# Patient Record
Sex: Male | Born: 1960 | Race: White | Hispanic: No | Marital: Married | Smoking: Never smoker
Health system: Southern US, Community
[De-identification: ages and names within clinical notes are randomized; demographics above are authoritative.]

## PROBLEM LIST (undated history)

## (undated) DIAGNOSIS — E781 Pure hyperglyceridemia: Secondary | ICD-10-CM

## (undated) DIAGNOSIS — J309 Allergic rhinitis, unspecified: Secondary | ICD-10-CM

## (undated) DIAGNOSIS — F40243 Fear of flying: Secondary | ICD-10-CM

## (undated) DIAGNOSIS — D693 Immune thrombocytopenic purpura: Secondary | ICD-10-CM

## (undated) HISTORY — DX: Allergic rhinitis, unspecified: J30.9

## (undated) HISTORY — DX: Immune thrombocytopenic purpura: D69.3

## (undated) HISTORY — DX: Fear of flying: F40.243

## (undated) HISTORY — DX: Pure hyperglyceridemia: E78.1

---

## 2002-04-14 HISTORY — PX: OTHER SURGICAL HISTORY: SHX169

## 2004-03-15 HISTORY — PX: HERNIA REPAIR: SHX51

## 2010-05-02 LAB — PSA: PSA: 0.57

## 2011-04-22 LAB — HEPATIC FUNCTION PANEL
ALT: 14 U/L (ref 10–40)
AST: 14 U/L (ref 14–40)

## 2011-04-22 LAB — LIPID PANEL
CHOLESTEROL: 178 mg/dL (ref 0–200)
HDL: 42 mg/dL (ref 35–70)
LDL CALC: 109 mg/dL
Triglycerides: 134 mg/dL (ref 40–160)

## 2011-04-22 LAB — CBC AND DIFFERENTIAL
Hemoglobin: 16.1 g/dL (ref 13.5–17.5)
Platelets: 439 10*3/uL — AB (ref 150–399)
WBC: 9.7 10*3/mL

## 2011-04-22 LAB — BASIC METABOLIC PANEL
Creatinine: 0.7 mg/dL (ref 0.6–1.3)
Glucose: 90 mg/dL

## 2014-03-08 ENCOUNTER — Encounter: Payer: Self-pay | Admitting: Family Medicine

## 2014-03-08 ENCOUNTER — Ambulatory Visit (INDEPENDENT_AMBULATORY_CARE_PROVIDER_SITE_OTHER): Payer: BC Managed Care – PPO | Admitting: Family Medicine

## 2014-03-08 VITALS — BP 122/76 | HR 76 | Ht 75.0 in | Wt 227.0 lb

## 2014-03-08 DIAGNOSIS — Z0000 Encounter for general adult medical examination without abnormal findings: Secondary | ICD-10-CM | POA: Diagnosis not present

## 2014-03-08 DIAGNOSIS — D693 Immune thrombocytopenic purpura: Secondary | ICD-10-CM | POA: Diagnosis not present

## 2014-03-08 DIAGNOSIS — Q8909 Congenital malformations of spleen: Secondary | ICD-10-CM | POA: Diagnosis not present

## 2014-03-08 DIAGNOSIS — Z1211 Encounter for screening for malignant neoplasm of colon: Secondary | ICD-10-CM

## 2014-03-08 DIAGNOSIS — Z23 Encounter for immunization: Secondary | ICD-10-CM

## 2014-03-08 DIAGNOSIS — Q8901 Asplenia (congenital): Secondary | ICD-10-CM | POA: Insufficient documentation

## 2014-03-08 DIAGNOSIS — Z13220 Encounter for screening for lipoid disorders: Secondary | ICD-10-CM

## 2014-03-08 DIAGNOSIS — L0100 Impetigo, unspecified: Secondary | ICD-10-CM

## 2014-03-08 DIAGNOSIS — Z125 Encounter for screening for malignant neoplasm of prostate: Secondary | ICD-10-CM

## 2014-03-08 DIAGNOSIS — Z131 Encounter for screening for diabetes mellitus: Secondary | ICD-10-CM

## 2014-03-08 HISTORY — DX: Immune thrombocytopenic purpura: D69.3

## 2014-03-08 LAB — CBC
HCT: 46.2 % (ref 39.0–52.0)
Hemoglobin: 16.6 g/dL (ref 13.0–17.0)
MCH: 33.1 pg (ref 26.0–34.0)
MCHC: 35.9 g/dL (ref 30.0–36.0)
MCV: 92 fL (ref 78.0–100.0)
PLATELETS: 491 10*3/uL — AB (ref 150–400)
RBC: 5.02 MIL/uL (ref 4.22–5.81)
RDW: 13.7 % (ref 11.5–15.5)
WBC: 8.1 10*3/uL (ref 4.0–10.5)

## 2014-03-08 MED ORDER — MUPIROCIN 2 % EX OINT: TOPICAL_OINTMENT | CUTANEOUS | Status: DC

## 2014-03-08 NOTE — Patient Instructions (Addendum)
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  Dr. Lajoyce Lauber General Advice Following Your Complete Physical Exam  The Benefits of Regular Exercise: Unless you suffer from an uncontrolled cardiovascular condition, studies strongly suggest that regular exercise and physical activity will add to both the quality and length of your life.  The World Health Organization recommends 150 minutes of moderate intensity aerobic activity every week.  This is best split over 3-4 days a week, and can be as simple as a brisk walk for just over 35 minutes "most days of the week".  This type of exercise has been shown to lower LDL-Cholesterol, lower average blood sugars, lower blood pressure, lower cardiovascular disease risk, improve memory, and increase one's overall sense of wellbeing.  The addition of anaerobic (or "strength training") exercises offers additional benefits including but not limited to increased metabolism, prevention of osteoporosis, and improved overall cholesterol levels.  How Can I Strive For A Low-Fat Diet?: Current guidelines recommend that 25-35 percent of your daily energy (food) intake should come from fats.  One might ask how can this be achieved without having to dissect each meal on a daily basis?  Switch to skim or 1% milk instead of whole milk.  Focus on lean meats such as ground [place], fresh fish, baked chicken, and lean cuts of beef as your source of dietary protein.  Limit saturated fat consumption to less than 10% of your daily caloric intake.  Limit trans fatty acid consumption primarily by limiting synthetic trans fats such as partially hydrogenated oils (Ex: fried fast foods).  Substitute olive or vegetable oil for solid fats where possible.  Moderation of Salt Intake: Provided you don't carry a diagnosis of congestive heart failure nor renal failure, I recommend a daily allowance of no more than  2300 mg of salt (sodium).  Keeping under this daily goal is associated with a decreased risk of cardiovascular events, creeping above it can lead to elevated blood pressures and increases your risk of cardiovascular events.  Milligrams (mg) of salt is listed on all nutrition labels, and your daily intake can add up faster than you think.  Most canned and frozen dinners can pack in over half your daily salt allowance in one meal.    Lifestyle Health Risks: Certain lifestyle choices carry specific health risks.  As you may already know, tobacco use has been associated with increasing one's risk of cardiovascular disease, pulmonary disease, numerous cancers, among many other issues.  What you may not know is that there are medications and nicotine replacement strategies that can more than double your chances of successfully quitting.  I would be thrilled to help manage your quitting strategy if you currently use tobacco products.  When it comes to alcohol use, I've yet to find an "ideal" daily allowance.  Provided an individual does not have a medical condition that is exacerbated by alcohol consumption, general guidelines determine "safe drinking" as no more than two standard drinks for a man or no more than one standard drink for a male per day.  However, much debate still exists on whether any amount of alcohol consumption is technically "safe".  My general advice, keep alcohol consumption to a minimum for general health promotion.  If you or others believe that alcohol, tobacco, or recreational drug use is interfering with your life, I would be happy to provide confidential counseling regarding treatment options.  General "Over  The Counter" Nutrition Advice: Postmenopausal women should aim for a daily calcium intake of 1200 mg, however a significant portion of this might already be provided by diets including milk, yogurt, cheese, and other dairy products.  Vitamin D has been shown to help preserve bone  density, prevent fatigue, and has even been shown to help reduce falls in the elderly.  Ensuring a daily intake of 800 Units of Vitamin D is a good place to start to enjoy the above benefits, we can easily check your Vitamin D level to see if you'd potentially benefit from supplementation beyond 800 Units a day.  Folic Acid intake should be of particular concern to women of childbearing age.  Daily consumption of 789-381 mcg of Folic Acid is recommended to minimize the chance of spinal cord defects in a fetus should pregnancy occur.    For many adults, accidents still remain one of the most common culprits when it comes to cause of death.  Some of the simplest but most effective preventitive habits you can adopt include regular seatbelt use, proper helmet use, securing firearms, and regularly testing your smoke and carbon monoxide detectors.  Egan Berkheimer B. [place], [place], [place] [address] Phone: [phone]

## 2014-03-08 NOTE — Progress Notes (Signed)
CC: Jon Galvan is a 53 y.o. 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 He believes he had the Pneumovax soon after this surgery but has never had Prevnar.  Review of Systems - General ROS: negative for - chills, fever, night sweats, weight gain or weight loss Ophthalmic ROS: negative for - decreased vision Psychological ROS: negative for - anxiety or depression ENT ROS: negative for - hearing change, nasal congestion, tinnitus or allergies Hematological and Lymphatic ROS: negative for - bleeding problems, bruising or swollen lymph nodes Breast ROS: negative Respiratory ROS: no cough, shortness of breath, or wheezing Cardiovascular ROS: no chest pain or dyspnea on exertion Gastrointestinal ROS: no abdominal pain, change in bowel habits, or black or bloody stools Genito-Urinary ROS: negative for - genital discharge, genital ulcers, incontinence or abnormal bleeding from genitals Musculoskeletal ROS: negative for - joint pain or muscle pain Neurological ROS: negative for - headaches or memory loss Dermatological ROS: negative for lumps, mole changes, rash and skin lesion changes  History reviewed. No pertinent past medical history.  Past Surgical History  Procedure Laterality Date  . Spleenectomy  04/2002  . Hernia repair  03/2004   History reviewed. No pertinent family history.  History   Social History  . Marital Status: Married    Spouse Name: N/A    Number of  Children: N/A  . Years of Education: N/A   Occupational History  . Not on file.   Social History Main Topics  . Smoking status: Never Smoker   . Smokeless tobacco: Not on file  . Alcohol Use: Not on file  . Drug Use: No  . Sexual Activity: Yes    Partners: Female   Other Topics Concern  . Not on file   Social History Narrative  . No narrative on file     Objective: BP 122/76  Pulse 76  Ht 6\' 3"  (1.905 m)  Wt 227 lb (102.967 kg)  BMI 28.37 kg/m2  General: No Acute Distress HEENT: Atraumatic, normocephalic, conjunctivae normal without scleral icterus.  No nasal discharge, hearing grossly intact, TMs with good landmarks bilaterally with no middle ear abnormalities, posterior pharynx clear without oral lesions. Mild ulceration with crusting just lateral to the left lip fold not involving the mucosa of the lips. Neck: Supple, trachea midline, no cervical nor supraclavicular adenopathy. Pulmonary: Clear to auscultation bilaterally without wheezing, rhonchi, nor rales. Cardiac: Regular rate and rhythm.  No murmurs, rubs, nor gallops. No peripheral edema.  2+ peripheral pulses bilaterally. Abdomen: Bowel sounds normal.  No masses.  Non-tender without rebound.  Negative Murphy's sign. MSK: Grossly intact, no signs of weakness.  Full strength throughout upper and lower extremities.  Full ROM in upper and lower extremities.  No midline spinal tenderness. Neuro: Gait unremarkable, CN II-XII grossly intact.  C5-C6 Reflex 2/4 Bilaterally, L4 Reflex 2/4 Bilaterally.  Cerebellar function intact. Skin: No rashes. Psych: Alert and oriented to person/place/time.  Thought process normal. No anxiety/depression. Assessment & Plan: Jon Galvan was seen today for establish  care.  Diagnoses and associated orders for this visit:  Annual physical exam - PSA - BASIC METABOLIC PANEL WITH GFR - Lipid panel - CBC - Tdap vaccine greater than or equal to 7yo IM  Lipid screening  Diabetes mellitus  screening  Prostate cancer screening  ITP (idiopathic thrombocytopenic purpura)  Colon cancer screening - Ambulatory referral to Gastroenterology  Impetigo - mupirocin ointment (BACTROBAN) 2 %; Apply to ingrown hair twice a day for ten days.    Healthy lifestyle interventions including but not limited to regular exercise, a healthy low fat diet, moderation of salt intake, the dangers of tobacco/alcohol/recreational drug use, nutrition supplementation, and accident avoidance were discussed with the patient and a handout was provided for future reference. Start Bactroban to the ulceration on the face if not improved after one week return for biopsy to rule out squamous versus basal cell carcinoma   Return if symptoms worsen or fail to improve, for biopsy of skin ulceration just left of the lips.

## 2014-03-09 ENCOUNTER — Encounter: Payer: Self-pay | Admitting: Family Medicine

## 2014-03-09 DIAGNOSIS — D473 Essential (hemorrhagic) thrombocythemia: Secondary | ICD-10-CM | POA: Insufficient documentation

## 2014-03-09 DIAGNOSIS — E781 Pure hyperglyceridemia: Secondary | ICD-10-CM | POA: Insufficient documentation

## 2014-03-09 DIAGNOSIS — M5412 Radiculopathy, cervical region: Secondary | ICD-10-CM | POA: Insufficient documentation

## 2014-03-09 DIAGNOSIS — D75839 Thrombocytosis, unspecified: Secondary | ICD-10-CM | POA: Insufficient documentation

## 2014-03-09 DIAGNOSIS — K130 Diseases of lips: Secondary | ICD-10-CM | POA: Insufficient documentation

## 2014-03-09 DIAGNOSIS — F411 Generalized anxiety disorder: Secondary | ICD-10-CM | POA: Insufficient documentation

## 2014-03-09 HISTORY — DX: Pure hyperglyceridemia: E78.1

## 2014-03-09 LAB — BASIC METABOLIC PANEL WITH GFR
BUN: 12 mg/dL (ref 6–23)
CO2: 23 mEq/L (ref 19–32)
Calcium: 9.3 mg/dL (ref 8.4–10.5)
Chloride: 104 mEq/L (ref 96–112)
Creat: 0.79 mg/dL (ref 0.50–1.35)
GFR, Est African American: >89 mL/min
GFR, Est Non African American: >89 mL/min
Glucose, Bld: 95 mg/dL (ref 70–99)
Potassium: 4.3 mEq/L (ref 3.5–5.3)
Sodium: 135 mEq/L (ref 135–145)

## 2014-03-09 LAB — LIPID PANEL
CHOL/HDL RATIO: 4.9 ratio
Cholesterol: 180 mg/dL (ref 0–200)
HDL: 37 mg/dL — ABNORMAL LOW (ref >39)
LDL Cholesterol: 88 mg/dL (ref 0–99)
Triglycerides: 274 mg/dL — ABNORMAL HIGH (ref <150)
VLDL: 55 mg/dL — ABNORMAL HIGH (ref 0–40)

## 2014-03-09 LAB — PSA: PSA: 0.72 ng/mL (ref <=4.00)

## 2014-03-11 ENCOUNTER — Encounter: Payer: Self-pay | Admitting: Family Medicine

## 2014-03-14 ENCOUNTER — Encounter: Payer: Self-pay | Admitting: Family Medicine

## 2014-03-14 DIAGNOSIS — Z299 Encounter for prophylactic measures, unspecified: Secondary | ICD-10-CM | POA: Insufficient documentation

## 2014-08-12 ENCOUNTER — Ambulatory Visit (INDEPENDENT_AMBULATORY_CARE_PROVIDER_SITE_OTHER): Payer: BLUE CROSS/BLUE SHIELD | Admitting: Family Medicine

## 2014-08-12 ENCOUNTER — Encounter: Payer: Self-pay | Admitting: Family Medicine

## 2014-08-12 VITALS — BP 139/87 | HR 91 | Wt 233.0 lb

## 2014-08-12 DIAGNOSIS — L98491 Non-pressure chronic ulcer of skin of other sites limited to breakdown of skin: Secondary | ICD-10-CM | POA: Diagnosis not present

## 2014-08-12 DIAGNOSIS — F411 Generalized anxiety disorder: Secondary | ICD-10-CM

## 2014-08-12 MED ORDER — CEPHALEXIN 500 MG PO CAPS: 500.0000 mg | ORAL_CAPSULE | Freq: Three times a day (TID) | ORAL | Status: DC

## 2014-08-12 MED ORDER — CLONAZEPAM 0.5 MG PO TABS
ORAL_TABLET | ORAL | Status: DC
Start: 2014-08-12 — End: 2014-09-30

## 2014-08-12 NOTE — Progress Notes (Signed)
CC: Jon Galvan is a 54 y.o. male is here for lesion on near mouth   Subjective: HPI:  Complains of a return of an ulceration just lateral to the left crease of the lips. It's been present off and on for a few years now. It seems to get better with Bactroban however this treatment has been more ineffective over the past few weeks. It is tender, occasionally bleeds, and he denies any other skin lesions elsewhere. Denies fevers, chills, swollen lymph nodes. No other interventions other than above.  Complaints of anxiety while flying. This has been present ever since he was a child. Since he turned 21 he usually drinks heavily to cope with the anxiety of flying. He has work obligations that will require him to fly to [place] in February and he does not want to drink alcohol to overcome his anxiety. He wants her something he can take to help reduce his anxiety other than alcohol. He denies any other anxiety that interferes with his quality of life. No history of substance abuse. Denies any other mental disturbance other than that described above   Review Of Systems Outlined In HPI  No past medical history on file.  Past Surgical History  Procedure Laterality Date  . Spleenectomy  04/2002  . Hernia repair Right 03/2004   No family history on file.  History   Social History  . Marital Status: Married    Spouse Name: N/A    Number of Children: N/A  . Years of Education: N/A   Occupational History  . Not on file.   Social History Main Topics  . Smoking status: Never Smoker   . Smokeless tobacco: Not on file  . Alcohol Use: Not on file  . Drug Use: No  . Sexual Activity:    Partners: Female   Other Topics Concern  . Not on file   Social History Narrative     Objective: BP 139/87 mmHg  Pulse 91  Wt 233 lb (105.688 kg)  General: Alert and Oriented, No Acute Distress HEENT: Pupils equal, round, reactive to light. Conjunctivae clear.  Moist mucous membranes. 5 mm diameter shallow  ulceration approximately 1 m lateral to the left lip crease. Lungs: Clear and comfortable work of breathing  Cardiac: Regular rate and rhythm.  Extremities: No peripheral edema.  Strong peripheral pulses.  Mental Status: No depression, anxiety, nor agitation. Skin: Warm and dry.  Assessment & Plan: Jon Galvan was seen today for lesion on near mouth.  Diagnoses and associated orders for this visit:  Skin ulceration, limited to breakdown of skin - cephALEXin (KEFLEX) 500 MG capsule; Take 1 capsule (500 mg total) by mouth 3 (three) times daily.  Anxiety state - clonazePAM (KLONOPIN) 0.5 MG tablet; One to two by mouth 30 minutes prior to flight as needed for anxiety.    Skin ulceration: He's responded to Keflex in the past and therefore restart this regimen and continue topical Bactroban. I've advised him to return after 1 week of treatment that has not fully healed because this would warrant skin biopsy. Anxiety: Parotid with as needed clonazepam to help with reducing his fear of flying. Advised to not drink alcohol while taking this medication and avoid operating heavy machinery   Return if symptoms worsen or fail to improve.

## 2014-08-17 ENCOUNTER — Telehealth: Payer: Self-pay | Admitting: Family Medicine

## 2014-08-17 NOTE — Telephone Encounter (Signed)
Dr Ileene Rubens. I called [facility] to follow up on referral and I was told by Lennette Bihari that Mr. Hairfield told them on 8/31 that he will call them back  to schedule an appt. I left vm for Mr. Raborn to call them to schedule appt.

## 2014-09-30 ENCOUNTER — Encounter: Payer: Self-pay | Admitting: Sports Medicine

## 2014-09-30 ENCOUNTER — Ambulatory Visit (INDEPENDENT_AMBULATORY_CARE_PROVIDER_SITE_OTHER): Payer: BLUE CROSS/BLUE SHIELD | Admitting: Sports Medicine

## 2014-09-30 VITALS — BP 124/79 | HR 58 | Temp 98.7°F | Ht 75.0 in | Wt 222.0 lb

## 2014-09-30 DIAGNOSIS — J309 Allergic rhinitis, unspecified: Secondary | ICD-10-CM

## 2014-09-30 DIAGNOSIS — J301 Allergic rhinitis due to pollen: Secondary | ICD-10-CM

## 2014-09-30 HISTORY — DX: Allergic rhinitis, unspecified: J30.9

## 2014-09-30 MED ORDER — FLUTICASONE PROPIONATE 50 MCG/ACT NA SUSP: NASAL | Status: DC

## 2014-09-30 NOTE — Assessment & Plan Note (Signed)
Starting Flonase. Counseled on avoiding the use of Afrin, I do think there is an element of rhinitis medicamentosa today. Return if no better in 2 weeks.

## 2014-09-30 NOTE — Progress Notes (Signed)
  Subjective:    CC:  "can't shake cold"  HPI: Patient presents with complaint of nasal congestion and cough for 3 weeks. His symptoms started when he traveled to [place], where all of the flora was in bloom. Since then his symptoms have waxed and waned, with another exacerbation in [place] where the flora was also in bloom. He denies any prior history of seasonal allergies and reports that he had traveled with another man that was "taking antibiotics for his sinuses". The cough is worst in the morning and he produces green sputum, but is dry and mild throughout the day. The patient has taken Nyquil and Afrin spray to treat his symptoms but they have offered only partial relief. The patient admits to using Afrin twice a day for 2 weeks. The patient denies any fever, chills, chest pain, SOB, ear pain, headache, facial pain, abdominal pain, nausea, vomiting, or diarrhea.   Past medical history, Surgical history, Family history not pertinant except as noted below, Social history, Allergies, and medications have been entered into the medical record, reviewed, and no changes needed.   Review of Systems: No fevers, chills, night sweats, weight loss, chest pain, or shortness of breath.   Objective:    General: Well Developed, well nourished, and in no acute distress.  Neuro: Alert and oriented x3, extra-ocular muscles intact, sensation grossly intact.  HEENT: Normocephalic, atraumatic, pupils equal round reactive to light, neck supple, no masses, no lymphadenopathy, thyroid nonpalpable. Posterior oropharynx with mild erythema but no tonsillar swelling or exudates. EAC's clear, TM's pearly with good light reflex bilaterally. Nasal mucosa is erythematous and boggy, with clear mucus. Skin: Warm and dry, no rashes. Cardiac: Regular rate and rhythm, no murmurs rubs or gallops, no lower extremity edema.  Respiratory: Clear to auscultation bilaterally. Not using accessory muscles, speaking in full  sentences.   Impression and Recommendations:    # Rhinitis - With chronicity of symptoms, this most likely represents an allergic rhinitis with an added element of rhinitis medicamentosa. Patient's cough appears to be due to a resulting post-nasal drip. - Patient advised to discontinue Afrin spray - Begin OTC allergy medication such as Claritin or Zyrtec - Begin fluticasone nasal spray BID  Follow up in 2 weeks or sooner as needed

## 2014-09-30 NOTE — Patient Instructions (Signed)

## 2015-01-27 LAB — HM COLONOSCOPY

## 2015-01-30 ENCOUNTER — Encounter: Payer: Self-pay | Admitting: Family Medicine

## 2015-01-30 DIAGNOSIS — Z860100 Personal history of colon polyps, unspecified: Secondary | ICD-10-CM | POA: Insufficient documentation

## 2015-02-08 ENCOUNTER — Ambulatory Visit (INDEPENDENT_AMBULATORY_CARE_PROVIDER_SITE_OTHER): Payer: BLUE CROSS/BLUE SHIELD | Admitting: Family Medicine

## 2015-02-08 ENCOUNTER — Encounter: Payer: Self-pay | Admitting: Family Medicine

## 2015-02-08 VITALS — BP 139/85 | HR 84 | Wt 224.0 lb

## 2015-02-08 DIAGNOSIS — L309 Dermatitis, unspecified: Secondary | ICD-10-CM | POA: Diagnosis not present

## 2015-02-08 DIAGNOSIS — F40243 Fear of flying: Secondary | ICD-10-CM | POA: Diagnosis not present

## 2015-02-08 MED ORDER — CLINDAMYCIN HCL 300 MG PO CAPS: 300.0000 mg | ORAL_CAPSULE | Freq: Three times a day (TID) | ORAL | Status: DC

## 2015-02-08 MED ORDER — CLONAZEPAM 0.5 MG PO TABS: ORAL_TABLET | ORAL | Status: DC

## 2015-02-08 NOTE — Progress Notes (Signed)
CC: Jon Galvan is a 54 y.o. male is here for lesion beside lip and anxiety meds for air travel   Subjective: HPI:  Follow-up fear of flying: Earlier this year I gave him prescriptions of clonazepam to help with a fear of flying. If he takes 1-1/2 before being on the plane he has no difficulty with any anxiety or nervousness while flying and denies any side effects. He denies any sedative effect other than reducing anxiety. He denies any tolerance to the medication and only takes it when flying. He is working with the [employer] and [employer] flying all over the country almost every other week.  Complains of a sore on the face just lateral to the left lip. It has completely resolved in the past with oral antibiotics sometimes it improves with Bactroban but lately it has not been improving despite use of Bactroban. Slightly tender to touch. Denies any discharge. Next fevers, chills, swollen lymph nodes unintentional weight loss nor mucosal lesions of the mouth.   Review Of Systems Outlined In HPI  No past medical history on file.  Past Surgical History  Procedure Laterality Date  . Spleenectomy  04/2002  . Hernia repair Right 03/2004   No family history on file.  History   Social History  . Marital Status: Married    Spouse Name: N/A  . Number of Children: N/A  . Years of Education: N/A   Occupational History  . Not on file.   Social History Main Topics  . Smoking status: Never Smoker   . Smokeless tobacco: Not on file  . Alcohol Use: Not on file  . Drug Use: No  . Sexual Activity:    Partners: Female   Other Topics Concern  . Not on file   Social History Narrative     Objective: BP 139/85 mmHg  Pulse 84  Wt 224 lb (101.606 kg)  General: Alert and Oriented, No Acute Distress HEENT: Pupils equal, round, reactive to light. Conjunctivae clear.  Moist mucous membranes times unremarkable. Buccal mucosal unremarkable Lungs: Clear comfortable work of breathing Cardiac: Regular  rate and rhythm.  Extremities: No peripheral edema.  Strong peripheral pulses.  Mental Status: No depression, anxiety, nor agitation. Skin: Warm and dry. Half centimeter circular ulceration just to the left of the lips  Assessment & Plan: Jon Galvan was seen today for lesion beside lip and anxiety meds for air travel.  Diagnoses and all orders for this visit:  Dermatitis Orders: -     clindamycin (CLEOCIN) 300 MG capsule; Take 1 capsule (300 mg total) by mouth 3 (three) times daily.  Fear of flying Orders: -     clonazePAM (KLONOPIN) 0.5 MG tablet; One to two by mouth 30 minutes prior to plane flight.   Dermatitis of the face: Start clindamycin, advising that he needs to return after 1 week if the lesion is not 100% resolved with clindamycin. There is a chance this could be squamous cell carcinoma and it would need a biopsy if not 100% improved with clindamycin. Fear flying: Controlled with clonazepam.  Return if symptoms worsen or fail to improve.

## 2015-04-17 ENCOUNTER — Encounter: Payer: Self-pay | Admitting: Family Medicine

## 2015-04-17 ENCOUNTER — Ambulatory Visit (INDEPENDENT_AMBULATORY_CARE_PROVIDER_SITE_OTHER): Payer: BLUE CROSS/BLUE SHIELD | Admitting: Family Medicine

## 2015-04-17 VITALS — BP 119/80 | HR 87 | Wt 225.0 lb

## 2015-04-17 DIAGNOSIS — K130 Diseases of lips: Secondary | ICD-10-CM

## 2015-04-17 DIAGNOSIS — M25512 Pain in left shoulder: Secondary | ICD-10-CM

## 2015-04-17 MED ORDER — MUPIROCIN 2 % EX OINT: TOPICAL_OINTMENT | CUTANEOUS | Status: DC

## 2015-04-17 MED ORDER — TRAMADOL HCL 50 MG PO TABS: 50.0000 mg | ORAL_TABLET | Freq: Three times a day (TID) | ORAL | Status: DC | PRN

## 2015-04-17 MED ORDER — MELOXICAM 15 MG PO TABS: 15.0000 mg | ORAL_TABLET | Freq: Every day | ORAL | Status: DC

## 2015-04-17 NOTE — Progress Notes (Signed)
CC: Jon Galvan is a 54 y.o. male is here for left shoulder pain   Subjective: HPI:  Left shoulder pain, present for the past 2 weeks, began after lifting heavy objects above his head. Now reproduced whenever lifting the arm above his head. Pain is described as on the back of the shoulder and radiates down the proximal left arm. No benefit from ibuprofen. Unable to rest at due to job demands. Denies any swelling redness or warmth of the joint. Pain feels similar to an injury he sustained many years ago that he was about to have surgery on however it went away after physical therapy. Symptoms are mild to moderate in severity. Denies chest pain, shortness of breath, cough.  He would like a refill on Bactroban, his using this on a wound just lateral to his where his left lips meet. Symptoms are worse the more he shaves. He tells me the wound was completely healed up until last week and he cut it while shaving. It's tender. It does not have any discharge. He denies skin changes elsewhere.   Review Of Systems Outlined In HPI  No past medical history on file.  Past Surgical History  Procedure Laterality Date  . Spleenectomy  04/2002  . Hernia repair Right 03/2004   No family history on file.  Social History   Social History  . Marital Status: Married    Spouse Name: N/A  . Number of Children: N/A  . Years of Education: N/A   Occupational History  . Not on file.   Social History Main Topics  . Smoking status: Never Smoker   . Smokeless tobacco: Not on file  . Alcohol Use: Not on file  . Drug Use: No  . Sexual Activity:    Partners: Female   Other Topics Concern  . Not on file   Social History Narrative     Objective: BP 119/80 mmHg  Pulse 87  Wt 225 lb (102.059 kg)  General: Alert and Oriented, No Acute Distress HEENT: Pupils equal, round, reactive to light. Conjunctivae clear.  Moist mucous membranes Lungs: Clear to auscultation bilaterally, no wheezing/ronchi/rales.   Comfortable work of breathing. Good air movement. Cardiac: Regular rate and rhythm. Normal S1/S2.  No murmurs, rubs, nor gallops.   Left shoulder exam reveals full range of motion and strength in all planes of motion and with individual rotator cuff testing. No overlying redness warmth or swelling.  Neer's test negative.  Hawkins test positive. Empty can positive. Crossarm test negative. O'Brien's test negative. Apprehension test negative. Speed's test negative. Extremities: No peripheral edema.  Strong peripheral pulses.  Mental Status: No depression, anxiety, nor agitation. Skin: Warm and dry. Mild erythema just lateral to the left lips  Assessment & Plan: Jon Galvan was seen today for left shoulder pain.  Diagnoses and all orders for this visit:  Left shoulder pain  Lip lesion  Other orders -     meloxicam (MOBIC) 15 MG tablet; Take 1 tablet (15 mg total) by mouth daily. -     traMADol (ULTRAM) 50 MG tablet; Take 1 tablet (50 mg total) by mouth every 8 (eight) hours as needed. -     mupirocin ointment (BACTROBAN) 2 %; Apply to sore on face three times a day as needed.   Lip lesion: Bactroban refill seems appropriate, if not fully healed in one week call for scheduling a biopsy. Left shoulder pain: Suspect rotator cuff pathology, start meloxicam and as needed tramadol. I've also given him a handout  going to rehabilitation stretches and exercises to do on a daily basis for the next 2-3 weeks. Discussed that it may take 2-3 weeks before he notices any benefit.   Return if symptoms worsen or fail to improve.

## 2015-08-18 ENCOUNTER — Telehealth: Payer: Self-pay

## 2015-08-18 NOTE — Telephone Encounter (Signed)
Jon Galvan called and would like a refill on Xanax for his fear of flying. This medication is not on current medication list. Please advise. Does he need a follow up?

## 2015-08-21 MED ORDER — CLONAZEPAM 0.5 MG PO TABS: ORAL_TABLET | ORAL | Status: DC

## 2015-08-21 NOTE — Telephone Encounter (Signed)
I'm familiar with this so no need for an appointment. I think he's referring to past prescriptions of clonazepam, I'll print off a Rx for this.

## 2015-08-21 NOTE — Telephone Encounter (Signed)
Pt.notified

## 2016-06-21 ENCOUNTER — Encounter: Payer: Self-pay | Admitting: Family Medicine

## 2016-06-21 ENCOUNTER — Ambulatory Visit (INDEPENDENT_AMBULATORY_CARE_PROVIDER_SITE_OTHER): Payer: BLUE CROSS/BLUE SHIELD | Admitting: Family Medicine

## 2016-06-21 VITALS — BP 135/78 | HR 90 | Wt 232.0 lb

## 2016-06-21 DIAGNOSIS — E781 Pure hyperglyceridemia: Secondary | ICD-10-CM | POA: Diagnosis not present

## 2016-06-21 DIAGNOSIS — D473 Essential (hemorrhagic) thrombocythemia: Secondary | ICD-10-CM | POA: Diagnosis not present

## 2016-06-21 DIAGNOSIS — Z0000 Encounter for general adult medical examination without abnormal findings: Secondary | ICD-10-CM

## 2016-06-21 DIAGNOSIS — D693 Immune thrombocytopenic purpura: Secondary | ICD-10-CM

## 2016-06-21 DIAGNOSIS — K130 Diseases of lips: Secondary | ICD-10-CM

## 2016-06-21 DIAGNOSIS — D75839 Thrombocytosis, unspecified: Secondary | ICD-10-CM

## 2016-06-21 DIAGNOSIS — F40243 Fear of flying: Secondary | ICD-10-CM | POA: Diagnosis not present

## 2016-06-21 HISTORY — DX: Fear of flying: F40.243

## 2016-06-21 MED ORDER — DOXYCYCLINE HYCLATE 100 MG PO TABS: 100.0000 mg | ORAL_TABLET | Freq: Two times a day (BID) | ORAL | 0 refills | Status: DC

## 2016-06-21 MED ORDER — CLONAZEPAM 0.5 MG PO TABS: ORAL_TABLET | ORAL | 1 refills | Status: DC

## 2016-06-21 NOTE — Patient Instructions (Addendum)
Thank you for coming in today. Get fasting labs in the near future.  We will contact you soon about the pathology results.  Continue the ointment.  Take doxycycline twice daily for 1 week for possible infection.     Sutured Wound Care Sutures are stitches that can be used to close wounds. Taking care of your wound properly can help to prevent pain and infection. It can also help your wound to heal more quickly. How is this treated? Wound Care  Keep the wound clean and dry.  If you were given a bandage (dressing), you should change it at least once per day or as directed by your health care provider. You should also change it if it becomes wet or dirty.  Keep the wound completely dry for the first 24 hours or as directed by your health care provider. After that time, you may shower or bathe. However, make sure that the wound is not soaked in water until the sutures have been removed.  Clean the wound one time each day or as directed by your health care provider.  Wash the wound with soap and water.  Rinse the wound with water to remove all soap.  Pat the wound dry with a clean towel. Do not rub the wound.  Aftercleaning the wound, apply a thin layer of antibioticointment as directed by your health care provider. This will help to prevent infection and keep the dressing from sticking to the wound.  Have the sutures removed as directed by your health care provider. General Instructions  Take or apply medicines only as directed by your health care provider.  To help prevent scarring, make sure to cover your wound with sunscreen whenever you are outside after the sutures are removed and the wound is healed. Make sure to wear a sunscreen of at least 30 SPF.  If you were prescribed an antibiotic medicine or ointment, finish all of it even if you start to feel better.  Do not scratch or pick at the wound.  Keep all follow-up visits as directed by your health care provider. This is  important.  Check your wound every day for signs of infection. Watch for:  Redness, swelling, or pain.  Fluid, blood, or pus.  Raise (elevate) the injured area above the level of your heart while you are sitting or lying down, if possible.  Avoid stretching your wound.  Drink enough fluids to keep your urine clear or pale yellow. Contact a health care provider if:  You received a tetanus shot and you have swelling, severe pain, redness, or bleeding at the injection site.  You have a fever.  A wound that was closed breaks open.  You notice a bad smell coming from the wound.  You notice something coming out of the wound, such as wood or glass.  Your pain is not controlled with medicine.  You have increased redness, swelling, or pain at the site of your wound.  You have fluid, blood, or pus coming from your wound.  You notice a change in the color of your skin near your wound.  You need to change the dressing frequently due to fluid, blood, or pus draining from the wound.  You develop a new rash.  You develop numbness around the wound. Get help right away if:  You develop severe swelling around the injury site.  Your pain suddenly increases and is severe.  You develop painful lumps near the wound or on skin that is anywhere on your  body.  You have a red streak going away from your wound.  The wound is on your hand or foot and you cannot properly move a finger or toe.  The wound is on your hand or foot and you notice that your fingers or toes look pale or bluish. This information is not intended to replace advice given to you by your health care provider. Make sure you discuss any questions you have with your health care provider. Document Released: 08/08/2004 Document Revised: 12/07/2015 Document Reviewed: 02/10/2013 Elsevier Interactive Patient Education  2017 [place] Carcinoma Introduction Basal cell carcinoma is the most common form of  skin cancer. It begins in the basal cells, which are at the bottom of the outer skin layer (epidermis). It occurs most often on parts of the body that are frequently exposed to the sun, such as:  Parts of the head, including the scalp or face.  Ears.  Neck.  Arms or legs.  Backs of the hands. Basal cell carcinoma can almost always be cured. It rarely spreads to other areas of the body (metastasizes). Basal cell carcinoma may come back at the same location (recur), but it can be treated again if this occurs. What are the causes? This condition is usually caused by exposure to ultraviolet (UV) light. UV light may come from the sun or from tanning beds. Other causes include:  Exposure to arsenic.  Exposure to radiation.  Exposure to toxic tars and oils.  Certain genetic conditions, such as xeroderma pigmentosum. What increases the risk? This condition is more likely to develop in:  People who are older than 55 years of age.  People who have fair skin (light complexion).  People who have blonde or red hair.  People who have blue, green, or gray eyes.  People who have childhood freckling.  People who have had sun exposure over long periods of time, especially during childhood.  People who have had repeated sunburns.  People who have a weakened immune system.  People who have been exposed to certain chemicals, such as tar, soot, and arsenic.  People who have chronic inflammatory conditions.  People who have chronic infections.  People who use tanning beds. What are the signs or symptoms? The main symptom of this condition is a growth or lesion on the skin. The shape and color of the growth or lesion may vary. The five main types include:  An open sore that may remain open for 3 weeks or longer. The sore may bleed or crust. This type of lesion can be an early sign of basal cell carcinoma. Basal cell carcinoma often shows up as a sore that does not heal.  A reddish area  that may crust, itch, or cause discomfort. This may occur on areas that are exposed to the sun. These patches might be easier to feel than to see.  A shiny or clear bump that is red, white, or pink. In people who have dark hair, the bump is often tan, black, or brown. These bumps can look like moles.  A pink growth with a raised border. The growth will have a crusted and indented area in the center. Small blood vessels may appear on the surface of the growth as it gets bigger.  A scarlike area that looks like shiny, stretched skin. The area may be white, yellow, or waxy. It often has irregular borders. This may be a sign of more aggressive basal cell carcinoma. How is this diagnosed? This  condition may be diagnosed with:  A physical exam.  Removal of a tissue sample to be examined under a microscope (biopsy). How is this treated? Treatment for this condition involves removing the cancerous tissue. The method that is used for this depends on the type, size, location, and number of tumors. Possible treatments include:  Mohs surgery. In this procedure, the cancerous skin cells are removed layer by layer until all of the tumor has been removed.  Surgical removal (excision) of the tumor. This involves removing the entire tumor and a small amount of normal skin that surrounds it.  Cryosurgery. This involves freezing the tumor with liquid nitrogen.  Plastic surgery. The tumor is removed, and healthy skin from another part of the body is used to cover the wound. This may be done for large tumors that are in areas where it is not possible to stretch the nearby skin to sew the edges of the wound together.  Radiation. This may be used for tumors on the face.  Photodynamic therapy. A chemical cream is applied to the skin, and light exposure is used to activate the chemical.  Electrodesiccation and curettage. This involves alternately scraping and burning the tumor while using an electric current to  control bleeding.  Chemical treatments, such as imiquimod cream and interferon injections. These may be used to remove superficial tumors with minimal scarring. Follow these instructions at home:  Avoid unprotected sun exposure.  Do self-exams as told by your health care provider. Look for new spots or changes in your skin.  Keep all follow-up visits as told by your health care provider. This is important. How is this prevented?  Avoid the sun when it is the strongest. This is usually between 10:00 a.m. and 4:00 p.m.  When you are out in the sun, use a sunscreen that has a sun protection factor (SPF) of at least 40.  Apply sunscreen at least 30 minutes before exposure to the sun.  Reapply sunscreen every 2-4 hours while you are outside. Also reapply it after swimming and after excessive sweating.  Always wear hats, protective clothing, and UV-blocking sunglasses when you are outdoors.  Do not use tanning beds. Contact a health care provider if:  You notice any new spots or any changes in your skin.  You have had a basal cell carcinoma tumor removed and you notice a new growth in the same location. This information is not intended to replace advice given to you by your health care provider. Make sure you discuss any questions you have with your health care provider. Document Released: 01/05/2003 Document Revised: 12/07/2015 Document Reviewed: 10/24/2014  2017 Elsevier

## 2016-06-21 NOTE — Progress Notes (Signed)
Jon Galvan is a 55 y.o. male who presents to [facility]: [place] today for well adult visit. Patient is here to establish care. His previous primary care provider has left the practice. This is the patient's first visit with me today. In general he feels pretty well with no acute medical problems. He gets lots of regular exercise and his job Neurosurgeon as part of corporate events. He travels around the country quite a lot. He does not take any medications regularly and does not smoke. He has a history of splenectomy and of ITP.   He has 2 active medical problems like to address today as well. 1) wound on skin of the corner of the mouth: Patient has a history of poorly healing ulcerated wounds at the corner of the left side of his mouth or his lips. These included treated off and on with mupirocin antibiotic ointment that seemed to help intermittently. He notes it's a little worse today than usual. He notes this is been going on for several years and seems to be worsening. He feels well with no fevers chills night sweats nausea vomiting or diarrhea.  2) fear of flying: Patient takes clonazepam intermittently for flying. He would like a refill this medication. He has lots of flying trips coming up in the near future.   Past Medical History:  Diagnosis Date  . Allergic rhinitis 09/30/2014  . 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 Hypertriglyceridemia 03/09/2014  . 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 HERNIA REPAIR Right 03/2004  . spleenectomy  04/2002   Social History  Substance Use Topics  . Smoking status: Never Smoker  . Smokeless tobacco: Not on file  . Alcohol use Not on file   family history is not on file.  ROS as above:  Medications: Current Outpatient Prescriptions  Medication Sig  Dispense Refill  . clonazePAM (KLONOPIN) 0.5 MG tablet One to two by mouth 30 minutes prior to flight as needed for anxiety. 30 tablet 1  . doxycycline (VIBRA-TABS) 100 MG tablet Take 1 tablet (100 mg total) by mouth 2 (two) times daily. 14 tablet 0   No current facility-administered medications for this visit.    Allergies  Allergen Reactions  . Shellfish Allergy     Health Maintenance Health Maintenance  Topic Date Due  . Hepatitis C Screening  June 12, 1961  . HIV Screening  09/18/1975  . INFLUENZA VACCINE  02/13/2016  . TETANUS/TDAP  03/08/2024  . COLONOSCOPY  01/26/2025     Exam:  BP 135/78   Pulse 90   Wt 232 lb (105.2 kg)   BMI 29.00 kg/m  Gen: Well NAD HEENT: EOMI,  MMM Lungs: Normal work of breathing. CTABL Heart: RRR no MRG Abd: NABS, Soft. Nondistended, Nontender Exts: Brisk capillary refill, warm and well perfused.  Skin: 1.1 cm annular with central ulcerated and raised edge lesion at the corner of the left side of the mouth with a smaller punctate with central umbilication 2 mm lesion just lateral. The skin is nontender and not particularly erythematous.  Shave biopsy: Consent obtained and timeout performed. Skin cleaned with alcohol and 1 mL of lidocaine injected achieving good anesthesia. A shallow shave biopsy was used to remove the larger ulcerated lesion. Patient had bleeding following the procedure. The bleeding did not resolve with compression for 5 minutes. Hyfrecation was attempted to use to control bleeding but was  not very successful. The bleeding was easily controlled to very slowly losing with 2 interrupted sutures using 4-0 Prolene across the wound. Patient tolerated the procedure very well.   No results found for this or any previous visit (from the past 72 hour(s)). No results found.    Assessment and Plan: 55 y.o. male with  Well adult. Doing pretty. We'll obtain basic labs listed below fasting in the near future.  Lip lesion:  Concerning for basal cell carcinoma. Shave biopsy with skin pathology pending. Bleeding controlled with's 2 simple interrupted sutures. Patient will return in one week for suture removal. Prophylactically provided doxycycline as this has been infected in the past .  Fear of flying: Clonazepam refilled.   Orders Placed This Encounter  Procedures  . CBC  . COMPLETE METABOLIC PANEL WITH GFR  . Hepatitis C antibody  . Lipid panel    Discussed warning signs or symptoms. Please see discharge instructions. Patient expresses understanding.

## 2016-06-26 ENCOUNTER — Other Ambulatory Visit: Payer: Self-pay | Admitting: Family Medicine

## 2016-06-26 NOTE — Addendum Note (Signed)
Addended by: Gregor Hams on: 06/26/2016 07:23 AM   Modules accepted: Orders

## 2017-04-16 ENCOUNTER — Encounter: Payer: Self-pay | Admitting: Family Medicine

## 2017-04-16 ENCOUNTER — Ambulatory Visit (INDEPENDENT_AMBULATORY_CARE_PROVIDER_SITE_OTHER): Payer: 59 | Admitting: Family Medicine

## 2017-04-16 VITALS — BP 123/77 | HR 68 | Ht 75.0 in | Wt 224.0 lb

## 2017-04-16 DIAGNOSIS — D473 Essential (hemorrhagic) thrombocythemia: Secondary | ICD-10-CM | POA: Diagnosis not present

## 2017-04-16 DIAGNOSIS — Q8901 Asplenia (congenital): Secondary | ICD-10-CM

## 2017-04-16 DIAGNOSIS — E781 Pure hyperglyceridemia: Secondary | ICD-10-CM

## 2017-04-16 DIAGNOSIS — D693 Immune thrombocytopenic purpura: Secondary | ICD-10-CM | POA: Diagnosis not present

## 2017-04-16 DIAGNOSIS — Z0000 Encounter for general adult medical examination without abnormal findings: Secondary | ICD-10-CM

## 2017-04-16 DIAGNOSIS — Z125 Encounter for screening for malignant neoplasm of prostate: Secondary | ICD-10-CM

## 2017-04-16 DIAGNOSIS — D75839 Thrombocytosis, unspecified: Secondary | ICD-10-CM

## 2017-04-16 LAB — CBC
HCT: 48.3 % (ref 38.5–50.0)
Hemoglobin: 17.1 g/dL (ref 13.2–17.1)
MCH: 32.7 pg (ref 27.0–33.0)
MCHC: 35.4 g/dL (ref 32.0–36.0)
MCV: 92.4 fL (ref 80.0–100.0)
MPV: 9.8 fL (ref 7.5–12.5)
PLATELETS: 370 10*3/uL (ref 140–400)
RBC: 5.23 10*6/uL (ref 4.20–5.80)
RDW: 13 % (ref 11.0–15.0)
WBC: 8.6 10*3/uL (ref 3.8–10.8)

## 2017-04-16 LAB — COMPLETE METABOLIC PANEL WITH GFR
AG RATIO: 1.5 (calc) (ref 1.0–2.5)
ALT: 16 U/L (ref 9–46)
AST: 17 U/L (ref 10–35)
Albumin: 4.4 g/dL (ref 3.6–5.1)
Alkaline phosphatase (APISO): 76 U/L (ref 40–115)
BUN: 13 mg/dL (ref 7–25)
CALCIUM: 9.5 mg/dL (ref 8.6–10.3)
CHLORIDE: 102 mmol/L (ref 98–110)
CO2: 29 mmol/L (ref 20–32)
Creat: 0.82 mg/dL (ref 0.70–1.33)
GFR, EST NON AFRICAN AMERICAN: 99 mL/min/{1.73_m2} (ref >=60)
GFR, Est African American: 115 mL/min/{1.73_m2} (ref >=60)
GLUCOSE: 101 mg/dL — AB (ref 65–99)
Globulin: 2.9 g/dL (calc) (ref 1.9–3.7)
POTASSIUM: 4.8 mmol/L (ref 3.5–5.3)
Sodium: 137 mmol/L (ref 135–146)
Total Bilirubin: 0.8 mg/dL (ref 0.2–1.2)
Total Protein: 7.3 g/dL (ref 6.1–8.1)

## 2017-04-16 LAB — LIPID PANEL W/REFLEX DIRECT LDL
Cholesterol: 215 mg/dL — ABNORMAL HIGH (ref <200)
HDL: 39 mg/dL — ABNORMAL LOW (ref >40)
LDL CHOLESTEROL (CALC): 139 mg/dL — AB
NON-HDL CHOLESTEROL (CALC): 176 mg/dL — AB (ref <130)
TRIGLYCERIDES: 231 mg/dL — AB (ref <150)
Total CHOL/HDL Ratio: 5.5 (calc) — ABNORMAL HIGH (ref <5.0)

## 2017-04-16 LAB — PSA: PSA: 0.7 ng/mL (ref <=4.0)

## 2017-04-16 NOTE — Patient Instructions (Signed)
Thank you for coming in today. Get labs now.  I will send results to you ASAP.  Recheck yearly.  Return sooner if needed.   Continue good exercises.

## 2017-04-16 NOTE — Progress Notes (Signed)
       Jon Galvan is a 56 y.o. male who presents to [facility]: [place] today for well adult visit.  [name] is doing well. He has a PMH significant for ITP requiring a splenectomy. He feels well with no issues. He exercises daily and tries to eat a balanced diet. He denies any fever, chills, NVD.     Past Medical History:  Diagnosis Date  . Allergic rhinitis 09/30/2014  . 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 Hypertriglyceridemia 03/09/2014  . 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 HERNIA REPAIR Right 03/2004  . spleenectomy  04/2002   Social History  Substance Use Topics  . Smoking status: Never Smoker  . Smokeless tobacco: Never Used  . Alcohol use 3.0 oz/week    5 Cans of beer per week   family history is not on file.  ROS as above:  Medications: Current Outpatient Prescriptions  Medication Sig Dispense Refill  . clonazePAM (KLONOPIN) 0.5 MG tablet One to two by mouth 30 minutes prior to flight as needed for anxiety. 30 tablet 1   No current facility-administered medications for this visit.    Allergies  Allergen Reactions  . Shellfish Allergy     Health Maintenance Health Maintenance  Topic Date Due  . Hepatitis C Screening  08/15/1960  . HIV Screening  09/18/1975  . INFLUENZA VACCINE  04/16/2018 (Originally 02/12/2017)  . TETANUS/TDAP  03/08/2024  . COLONOSCOPY  01/26/2025     Exam:  BP 123/77   Pulse 68   Ht 6\' 3"  (1.905 m)   Wt 224 lb (101.6 kg)   BMI 28.00 kg/m   Wt Readings from Last 5 Encounters:  04/16/17 224 lb (101.6 kg)  06/21/16 232 lb (105.2 kg)  04/17/15 225 lb (102.1 kg)  02/08/15 224 lb (101.6 kg)  09/30/14 222 lb (100.7 kg)    Gen: Well NAD HEENT: EOMI,  MMM Lungs: Normal work of breathing. CTABL Heart: RRR no MRG Abd: NABS, Soft. Nondistended, Nontender Exts: Brisk  capillary refill, warm and well perfused.  Skin: No concerning lesions.     No results found for this or any previous visit (from the past 72 hour(s)). No results found.    Assessment and Plan: 56 y.o. male with Well adult.  Well adult. Doing well overall. Plan to check fasting labs as well as PSA.  Will follow up results.  Continue exercises.  Return yearly.   Flu vaccine declined.    Orders Placed This Encounter  Procedures  . CBC  . COMPLETE METABOLIC PANEL WITH GFR  . Lipid Panel w/reflex Direct LDL  . PSA   No orders of the defined types were placed in this encounter.    Discussed warning signs or symptoms. Please see discharge instructions. Patient expresses understanding.

## 2017-04-19 ENCOUNTER — Emergency Department (HOSPITAL_COMMUNITY)
Admission: EM | Admit: 2017-04-19 | Discharge: 2017-04-19 | Disposition: A | Discharge status: Home / Self Care | Payer: Worker's Compensation | Attending: Emergency Medicine | Admitting: Emergency Medicine

## 2017-04-19 ENCOUNTER — Encounter (HOSPITAL_COMMUNITY): Payer: Self-pay | Admitting: Emergency Medicine

## 2017-04-19 ENCOUNTER — Emergency Department (HOSPITAL_COMMUNITY): Payer: Worker's Compensation

## 2017-04-19 DIAGNOSIS — Y929 Unspecified place or not applicable: Secondary | ICD-10-CM | POA: Diagnosis not present

## 2017-04-19 DIAGNOSIS — W19XXXA Unspecified fall, initial encounter: Secondary | ICD-10-CM

## 2017-04-19 DIAGNOSIS — S43004A Unspecified dislocation of right shoulder joint, initial encounter: Secondary | ICD-10-CM | POA: Diagnosis not present

## 2017-04-19 DIAGNOSIS — Y999 Unspecified external cause status: Secondary | ICD-10-CM | POA: Diagnosis not present

## 2017-04-19 DIAGNOSIS — W11XXXA Fall on and from ladder, initial encounter: Secondary | ICD-10-CM | POA: Diagnosis not present

## 2017-04-19 DIAGNOSIS — Y939 Activity, unspecified: Secondary | ICD-10-CM | POA: Diagnosis not present

## 2017-04-19 DIAGNOSIS — S8012XA Contusion of left lower leg, initial encounter: Secondary | ICD-10-CM

## 2017-04-19 DIAGNOSIS — S4991XA Unspecified injury of right shoulder and upper arm, initial encounter: Secondary | ICD-10-CM | POA: Diagnosis present

## 2017-04-19 LAB — CBC
HCT: 47 % (ref 39.0–52.0)
HEMOGLOBIN: 16.2 g/dL (ref 13.0–17.0)
MCH: 32.3 pg (ref 26.0–34.0)
MCHC: 34.5 g/dL (ref 30.0–36.0)
MCV: 93.6 fL (ref 78.0–100.0)
PLATELETS: 333 10*3/uL (ref 150–400)
RBC: 5.02 MIL/uL (ref 4.22–5.81)
RDW: 14.1 % (ref 11.5–15.5)
WBC: 14 10*3/uL — ABNORMAL HIGH (ref 4.0–10.5)

## 2017-04-19 LAB — BASIC METABOLIC PANEL
Anion gap: 12 (ref 5–15)
BUN: 16 mg/dL (ref 6–20)
CALCIUM: 9.4 mg/dL (ref 8.9–10.3)
CHLORIDE: 103 mmol/L (ref 101–111)
CO2: 20 mmol/L — AB (ref 22–32)
Creatinine, Ser: 0.88 mg/dL (ref 0.61–1.24)
GLUCOSE: 132 mg/dL — AB (ref 65–99)
Potassium: 3.6 mmol/L (ref 3.5–5.1)
SODIUM: 135 mmol/L (ref 135–145)

## 2017-04-19 MED ORDER — ETOMIDATE 2 MG/ML IV SOLN
INTRAVENOUS | Status: AC | PRN
  Administered 2017-04-19: 10 mg via INTRAVENOUS

## 2017-04-19 MED ORDER — PROPOFOL 10 MG/ML IV BOLUS
INTRAVENOUS | Status: AC | PRN
  Administered 2017-04-19: 30 mg via INTRAVENOUS
  Administered 2017-04-19 (×2): 40 mg via INTRAVENOUS
  Administered 2017-04-19: 10 mg via INTRAVENOUS
  Administered 2017-04-19: 20 mg via INTRAVENOUS
  Administered 2017-04-19 (×2): 30 mg via INTRAVENOUS

## 2017-04-19 MED ORDER — HYDROMORPHONE HCL 1 MG/ML IJ SOLN
0.5000 mg | Freq: Once | INTRAMUSCULAR | Status: AC
  Administered 2017-04-19: 0.5 mg via INTRAVENOUS
  Filled 2017-04-19: qty 1

## 2017-04-19 MED ORDER — PROPOFOL 10 MG/ML IV BOLUS
INTRAVENOUS | Status: AC
  Filled 2017-04-19: qty 20

## 2017-04-19 MED ORDER — ETOMIDATE 2 MG/ML IV SOLN
16.0000 mg | Freq: Once | INTRAVENOUS | Status: DC
  Filled 2017-04-19: qty 10

## 2017-04-19 MED ORDER — ETOMIDATE 2 MG/ML IV SOLN
INTRAVENOUS | Status: AC | PRN
  Administered 2017-04-19: 4 mg via INTRAVENOUS

## 2017-04-19 MED ORDER — ACETAMINOPHEN 500 MG PO TABS: 1000.0000 mg | ORAL_TABLET | Freq: Three times a day (TID) | ORAL | 0 refills | Status: AC

## 2017-04-19 MED ORDER — HYDROMORPHONE HCL 1 MG/ML IJ SOLN
1.0000 mg | Freq: Once | INTRAMUSCULAR | Status: AC
  Administered 2017-04-19: 1 mg via INTRAVENOUS
  Filled 2017-04-19: qty 1

## 2017-04-19 MED ORDER — ETOMIDATE 2 MG/ML IV SOLN
INTRAVENOUS | Status: AC | PRN
  Administered 2017-04-19: 2 mg via INTRAVENOUS

## 2017-04-19 MED ORDER — CYCLOBENZAPRINE HCL 10 MG PO TABS: 10.0000 mg | ORAL_TABLET | Freq: Every day | ORAL | 0 refills | Status: AC

## 2017-04-19 NOTE — ED Notes (Signed)
Pt stable, ambulatory, states understanding of discharge instyructions

## 2017-04-19 NOTE — ED Provider Notes (Signed)
[facility] DEPT Provider Note   CSN: [id number] Arrival date & time: 04/19/17  1105     History   Chief Complaint Chief Complaint  Patient presents with  . Fall  . Shoulder Injury    HPI Jon Galvan is a 56 y.o. male.  The history is provided by the patient.  Shoulder Injury  This is a new problem. The current episode started 1 to 2 hours ago. The problem occurs constantly. The problem has not changed since onset.Pertinent negatives include no chest pain, no abdominal pain, no headaches and no shortness of breath. Exacerbated by: movement. Nothing relieves the symptoms. He has tried nothing for the symptoms.   Pt slipped down a ladder. Right arm was thrusted upward during the fall. Landed with right arm abducted.  Denies head trauma or other injuries.  Past Medical History:  Diagnosis Date  . Allergic rhinitis 09/30/2014  . 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 Hypertriglyceridemia 03/09/2014  . Idiopathic thrombocytopenic purpura ([place]) 03/08/2014    Patient Active Problem List   Diagnosis Date Noted  . 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 History of colonic polyps 01/30/2015  . Allergic rhinitis 09/30/2014  . Hypertriglyceridemia 03/09/2014  . Lip lesion 03/09/2014  . Thrombocytosis ([place]) 03/09/2014  . 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 HERNIA REPAIR Right 03/2004  . 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 Authorizing Provider  ibuprofen (ADVIL,MOTRIN) 200 MG tablet Take 800 mg by mouth every 6 (six) hours as needed for mild pain.   Yes [provider]  acetaminophen (TYLENOL) 500 MG tablet Take 2 tablets (1,000 mg total) by mouth every 8 (eight) hours. Do not take more than 4000 mg of acetaminophen (Tylenol) in a 24-hour period. Please note that other medicines that you may be  prescribed may have Tylenol as well. 04/19/17 04/24/17  Fatima Blank, MD  clonazePAM (KLONOPIN) 0.5 MG tablet One to two by mouth 30 minutes prior to flight as needed for anxiety. Patient not taking: Reported on 04/19/2017 06/21/16   Gregor Hams, MD  cyclobenzaprine (FLEXERIL) 10 MG tablet Take 1 tablet (10 mg total) by mouth at bedtime. 04/19/17 04/29/17  Fatima Blank, MD    Family History No family history on file.  Social History Social History  Substance Use Topics  . Smoking status: Never Smoker  . Smokeless tobacco: Never Used  . Alcohol use 3.0 oz/week    5 Cans of beer per week     Allergies   Shellfish allergy   Review of Systems Review of Systems  Respiratory: Negative for shortness of breath.   Cardiovascular: Negative for chest pain.  Gastrointestinal: Negative for abdominal pain.  Neurological: Negative for headaches.     Physical Exam Updated Vital Signs BP 114/66   Pulse (!) 57   Temp 98 F (36.7 C) (Oral)   Resp (!) 22   Ht 6\' 3"  (1.905 m)   Wt 101.6 kg (224 lb)   SpO2 97%   BMI 28.00 kg/m   Physical Exam  Constitutional: He is oriented to person, place, and time. He appears well-developed and well-nourished. No distress.  HENT:  Head: Normocephalic.  Right Ear: External ear normal.  Left Ear: External ear normal.  Mouth/Throat: Oropharynx is clear and moist.  Eyes: Pupils are equal,  round, and reactive to light. Conjunctivae and EOM are normal. Right eye exhibits no discharge. Left eye exhibits no discharge. No scleral icterus.  Neck: Normal range of motion. Neck supple.  Cardiovascular: Regular rhythm and normal heart sounds.  Exam reveals no gallop and no friction rub.   No murmur heard. Pulses:      Radial pulses are 2+ on the right side, and 2+ on the left side.       Dorsalis pedis pulses are 2+ on the right side, and 2+ on the left side.  Pulmonary/Chest: Effort normal and breath sounds normal. No stridor. No  respiratory distress.  Abdominal: Soft. He exhibits no distension. There is no tenderness.  Musculoskeletal:       Right shoulder: He exhibits decreased range of motion, tenderness and deformity.       Cervical back: He exhibits no bony tenderness.       Thoracic back: He exhibits no bony tenderness.       Lumbar back: He exhibits no bony tenderness.       Left lower leg: He exhibits no tenderness and no bony tenderness.       Legs: Clavicle stable. Chest stable to AP/Lat compression. Pelvis stable to Lat compression.  No chest or abdominal wall contusion.  Neurological: He is alert and oriented to person, place, and time. GCS eye subscore is 4. GCS verbal subscore is 5. GCS motor subscore is 6.  Moving all extremities   Skin: Skin is warm. He is not diaphoretic.     ED Treatments / Results  Labs (all labs ordered are listed, but only abnormal results are displayed) Labs Reviewed  CBC - Abnormal; Notable for the following:       Result Value   WBC 14.0 (*)    All other components within normal limits  BASIC METABOLIC PANEL - Abnormal; Notable for the following:    CO2 20 (*)    Glucose, Bld 132 (*)    All other components within normal limits    EKG  EKG Interpretation None       Radiology Dg Tibia/fibula Left  Result Date: 04/19/2017 CLINICAL DATA:  Status post fall from a ladder EXAM: LEFT TIBIA AND FIBULA - 2 VIEW COMPARISON:  None. FINDINGS: There is no evidence of fracture or other focal bone lesions. Soft tissues are unremarkable. IMPRESSION: No acute osseous injury of the left tibia and fibula. Electronically Signed   By: Kathreen Devoid   On: 04/19/2017 15:43   Dg Shoulder Right Portable  Result Date: 04/19/2017 CLINICAL DATA:  Status post fall off ladder EXAM: PORTABLE RIGHT SHOULDER COMPARISON:  None. FINDINGS: There is no fracture or dislocation. There are mild degenerative changes of the acromioclavicular joint. IMPRESSION: Interval reduction of right  shoulder dislocation. Electronically Signed   By: Kathreen Devoid   On: 04/19/2017 15:39   Dg Shoulder Right Portable  Result Date: 04/19/2017 CLINICAL DATA:  [place] from ladder.  Pain and deformity. EXAM: PORTABLE RIGHT SHOULDER COMPARISON:  None. FINDINGS: Anterior dislocation of the humeral head relative to the glenoid. No visible fracture. IMPRESSION: Anterior shoulder dislocation. Electronically Signed   By: Nelson Chimes M.D.   On: 04/19/2017 12:17    Procedures .Sedation Date/Time: 04/19/2017 2:35 PM Performed by: Fatima Blank Authorized by: Fatima Blank   Consent:    Consent obtained:  Verbal and written   Consent given by:  Patient   Risks discussed:  Allergic reaction, dysrhythmia, inadequate sedation, nausea, prolonged hypoxia  resulting in organ damage, prolonged sedation necessitating reversal, respiratory compromise necessitating ventilatory assistance and intubation and vomiting   Alternatives discussed:  Analgesia without sedation, anxiolysis and regional anesthesia Universal protocol:    Procedure explained and questions answered to patient or proxy's satisfaction: yes     Relevant documents present and verified: yes     Test results available and properly labeled: yes     Imaging studies available: yes     Required blood products, implants, devices, and special equipment available: yes     Site/side marked: yes     Immediately prior to procedure a time out was called: yes     Patient identity confirmation method:  Verbally with patient Indications:    Procedure necessitating sedation performed by:  Physician performing sedation   Intended level of sedation:  Deep Pre-sedation assessment:    Time since last food or drink:  0800   ASA classification: class 1 - normal, healthy patient     Neck mobility: normal     Mouth opening:  3 or more finger widths   Thyromental distance:  4 finger widths   Mallampati score:  I - soft palate, uvula, fauces, pillars  visible   Pre-sedation assessments completed and reviewed: airway patency, cardiovascular function, hydration status, mental status, nausea/vomiting, pain level, respiratory function and temperature   Immediate pre-procedure details:    Reassessment: Patient reassessed immediately prior to procedure     Reviewed: vital signs, relevant labs/tests and NPO status     Verified: bag valve mask available, emergency equipment available, intubation equipment available, IV patency confirmed, oxygen available and suction available   Procedure details (see MAR for exact dosages):    Preoxygenation:  Nasal cannula   Sedation:  Propofol and etomidate   Intra-procedure monitoring:  Blood pressure monitoring, cardiac monitor, continuous pulse oximetry, frequent LOC assessments, frequent vital sign checks and continuous capnometry   Intra-procedure events: none     Total Provider sedation time (minutes):  22 Post-procedure details:    Attendance: Constant attendance by certified staff until patient recovered     Recovery: Patient returned to pre-procedure baseline     Post-sedation assessments completed and reviewed: airway patency, cardiovascular function, hydration status, mental status, nausea/vomiting, pain level, respiratory function and temperature     Patient is stable for discharge or admission: yes     Patient tolerance:  Tolerated well, no immediate complications ORTHOPEDIC INJURY TREATMENT Date/Time: 04/19/2017 2:37 PM Performed by: Fatima Blank Authorized by: Fatima Blank   Consent:    Consent obtained:  Written   Consent given by:  Patient   Risks discussed:  Fracture, irreducible dislocation, recurrent dislocation, stiffness and restricted joint movement   Alternatives discussed:  ImmobilizationInjury location: shoulder Location details: right shoulder Injury type: dislocation Pre-procedure neurovascular assessment: neurovascularly intact  Patient sedated: Yes. Refer  to sedation procedure documentation for details of sedation. Manipulation performed: yes Reduction method: traction and counter traction Immobilization: sling Post-procedure neurovascular assessment: post-procedure neurovascularly intact Patient tolerance: Patient tolerated the procedure well with no immediate complications    (including critical care time)  Medications Ordered in ED Medications  etomidate (AMIDATE) injection 16 mg (not administered)  propofol (DIPRIVAN) 10 mg/mL bolus/IV push (not administered)  HYDROmorphone (DILAUDID) injection 0.5 mg (0.5 mg Intravenous Given 04/19/17 1145)  HYDROmorphone (DILAUDID) injection 1 mg (1 mg Intravenous Given 04/19/17 1248)  etomidate (AMIDATE) injection (10 mg Intravenous Given 04/19/17 1414)  etomidate (AMIDATE) injection (2 mg Intravenous Given 04/19/17 1415)  etomidate (AMIDATE)  injection (4 mg Intravenous Given 04/19/17 1416)  propofol (DIPRIVAN) 10 mg/mL bolus/IV push (30 mg Intravenous Given 04/19/17 1431)     Initial Impression / Assessment and Plan / ED Course  I have reviewed the triage vital signs and the nursing notes.  Pertinent labs & imaging results that were available during my care of the patient were reviewed by me and considered in my medical decision making (see chart for details).     Workup consistent with right shoulder dislocation. Reduction as above was successful. No evidence of post reduction fracture. Placed in a sling and swathe. Plain film of the left tib-fib negative for acute fracture. No other injuries noted on exam.  The patient is safe for discharge with strict return precautions.   Final Clinical Impressions(s) / ED Diagnoses   Final diagnoses:  Fall  Fall, initial encounter  Dislocation of right shoulder joint, initial encounter  Leg hematoma, left, initial encounter   Disposition: Discharge  Condition: Good  I have discussed the results, Dx and Tx plan with the patient who expressed  understanding and agree(s) with the plan. Discharge instructions discussed at great length. The patient was given strict return precautions who verbalized understanding of the instructions. No further questions at time of discharge.    New Prescriptions   ACETAMINOPHEN (TYLENOL) 500 MG TABLET    Take 2 tablets (1,000 mg total) by mouth every 8 (eight) hours. Do not take more than 4000 mg of acetaminophen (Tylenol) in a 24-hour period. Please note that other medicines that you may be prescribed may have Tylenol as well.   CYCLOBENZAPRINE (FLEXERIL) 10 MG TABLET    Take 1 tablet (10 mg total) by mouth at bedtime.    Follow Up: Paralee Cancel, MD [address] [place] [place] [address] [phone]  Schedule an appointment as soon as possible for a visit  As needed  Gregor Hams, [place] [address] [phone]   in 3-5 days for further assistance with pain control      Cardama, Grayce Sessions, MD 04/19/17 1658

## 2017-04-19 NOTE — ED Triage Notes (Addendum)
Pt states he fell off of the 8 foot ladder, at the 6th foot. Denies hitting his head. Right shoulder pain, pt diaphoretic. Pulses WNL, color to right arm WNL.  Vitals stable. Last food 0700 last liquid 1 hour ago.

## 2017-10-09 ENCOUNTER — Other Ambulatory Visit: Payer: Self-pay

## 2017-10-09 DIAGNOSIS — F40243 Fear of flying: Secondary | ICD-10-CM

## 2017-10-09 MED ORDER — CLONAZEPAM 0.5 MG PO TABS: ORAL_TABLET | ORAL | 1 refills | Status: DC

## 2017-10-09 NOTE — Telephone Encounter (Signed)
Patient called requested a refill for Clonazepam. He stated that he takes it when he fly and he has an upcoming flight . Please advise. Reyne Falconi,CMA

## 2018-04-15 IMAGING — DX DG SHOULDER 2+V PORT*R*
2 series · 2 of 2 positions shown · non-contrast
Comparison: None.

CLINICAL DATA: Fell from ladder.  Pain and deformity.

EXAM:
PORTABLE RIGHT SHOULDER

[shoulder ap (1 of 2)]
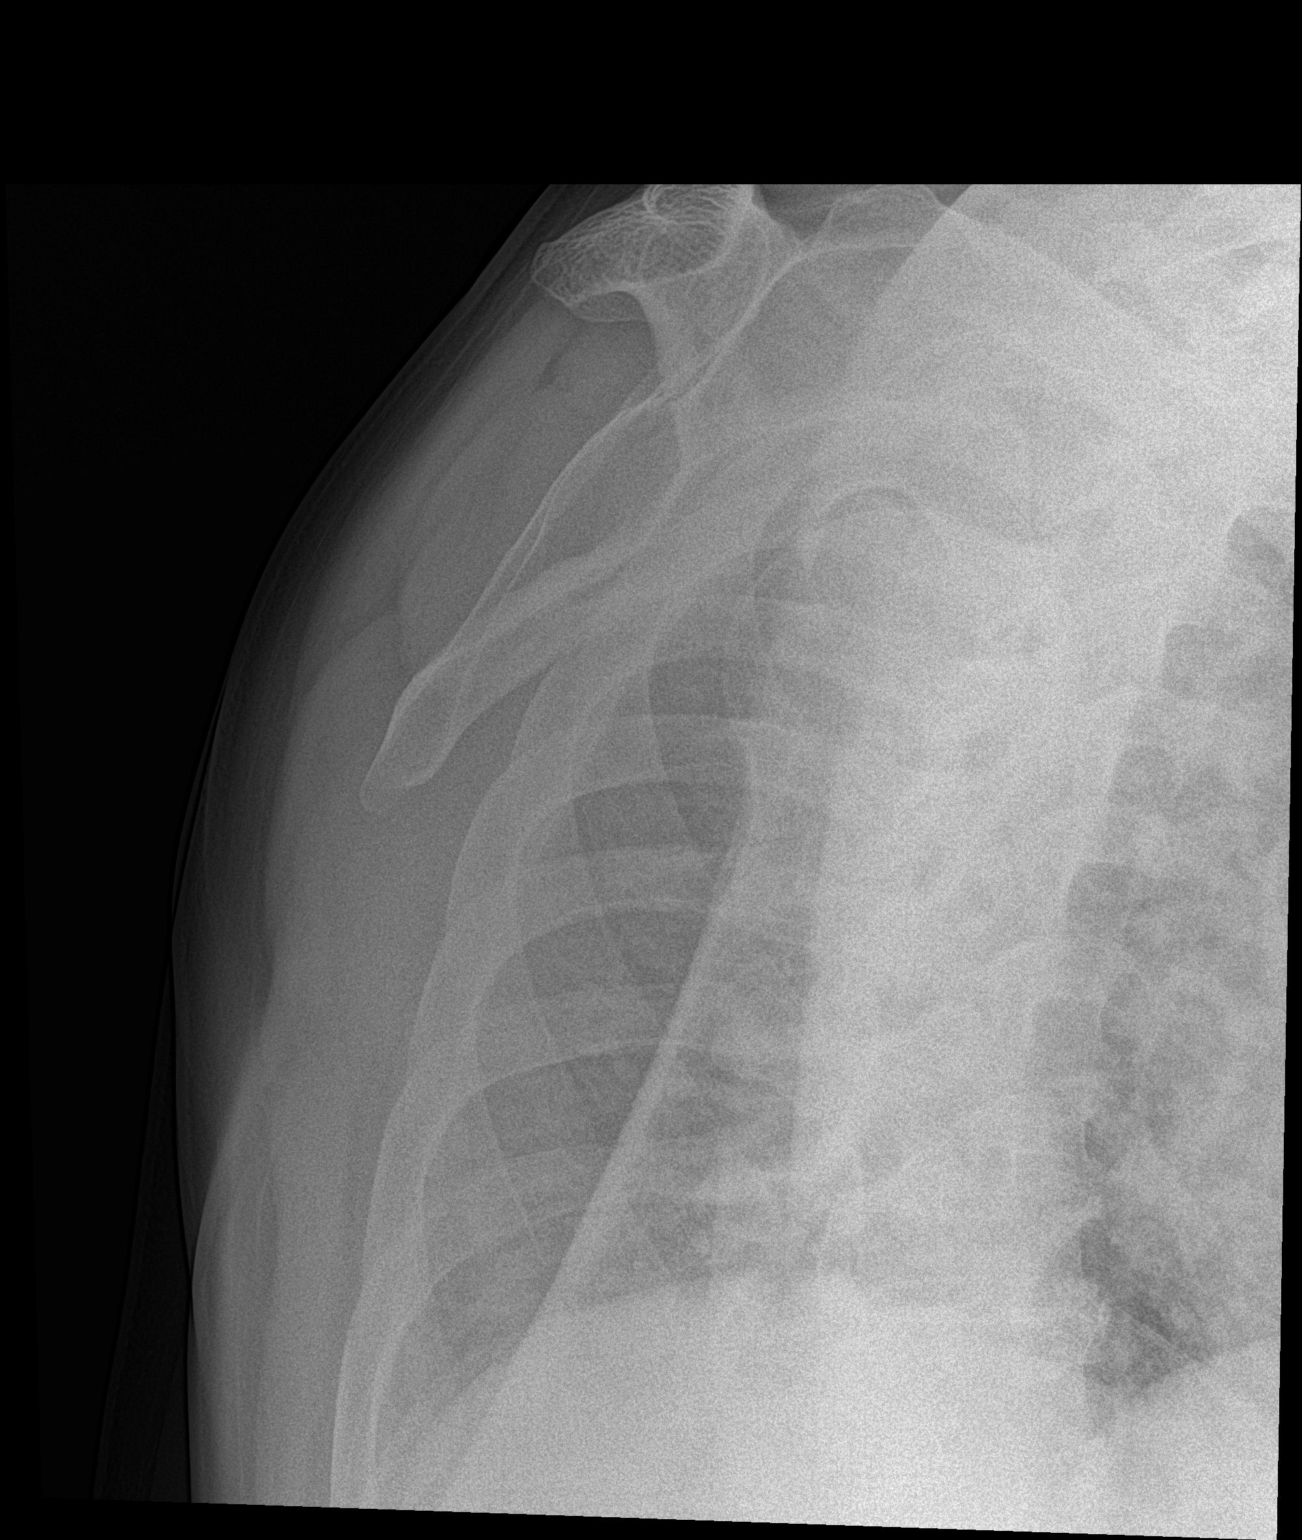

[shoulder ap (2 of 2)]
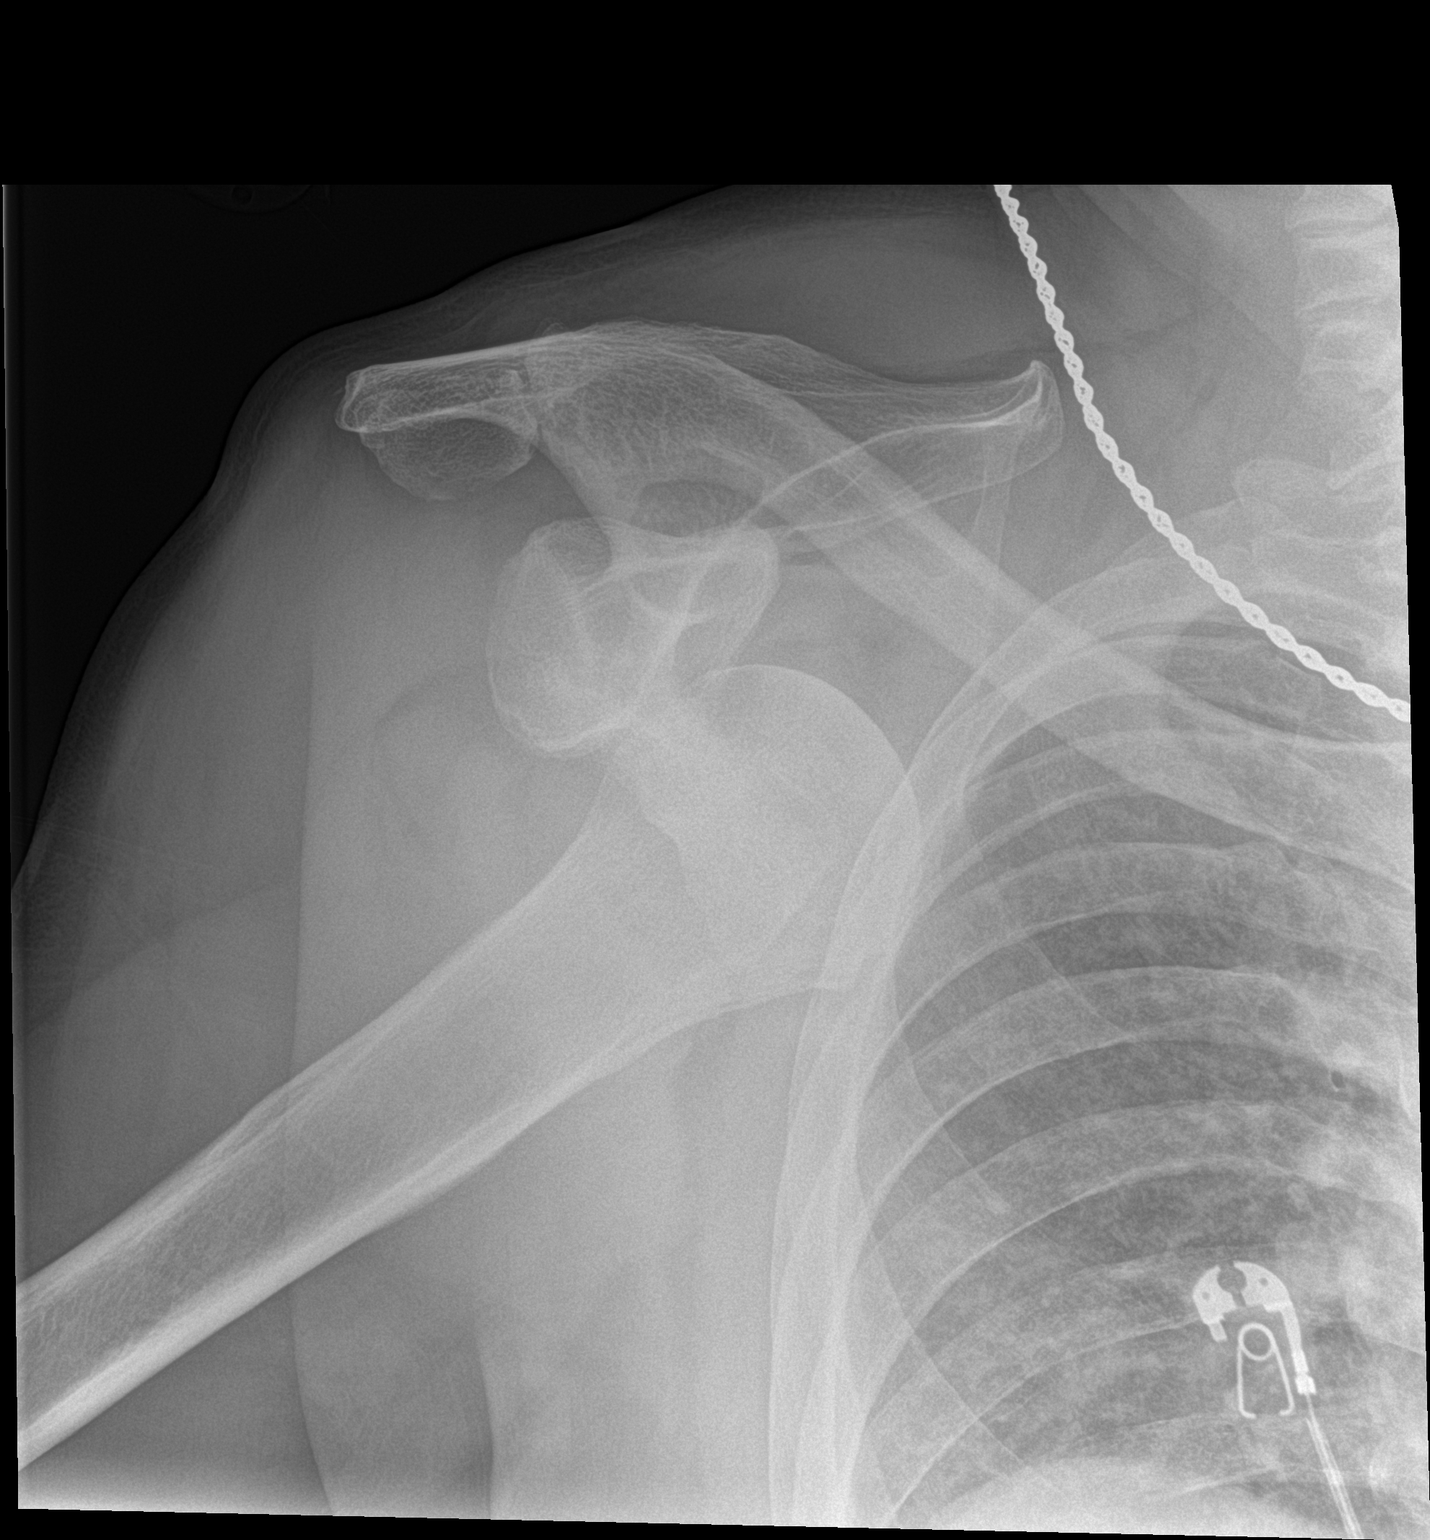

[2 of 2 positions shown; findings below may reference images not displayed]

FINDINGS: Anterior dislocation of the humeral head relative to the glenoid. No
visible fracture.
IMPRESSION: Anterior shoulder dislocation.

## 2018-04-15 IMAGING — DX DG SHOULDER 2+V PORT*R*
1 series · 3 of 3 positions shown · non-contrast
Comparison: None.

CLINICAL DATA: Status post fall off ladder

EXAM:
PORTABLE RIGHT SHOULDER

[Series 1: shoulder · 0.14mm/px · 3 of 3 slices shown]
[im 1/3]
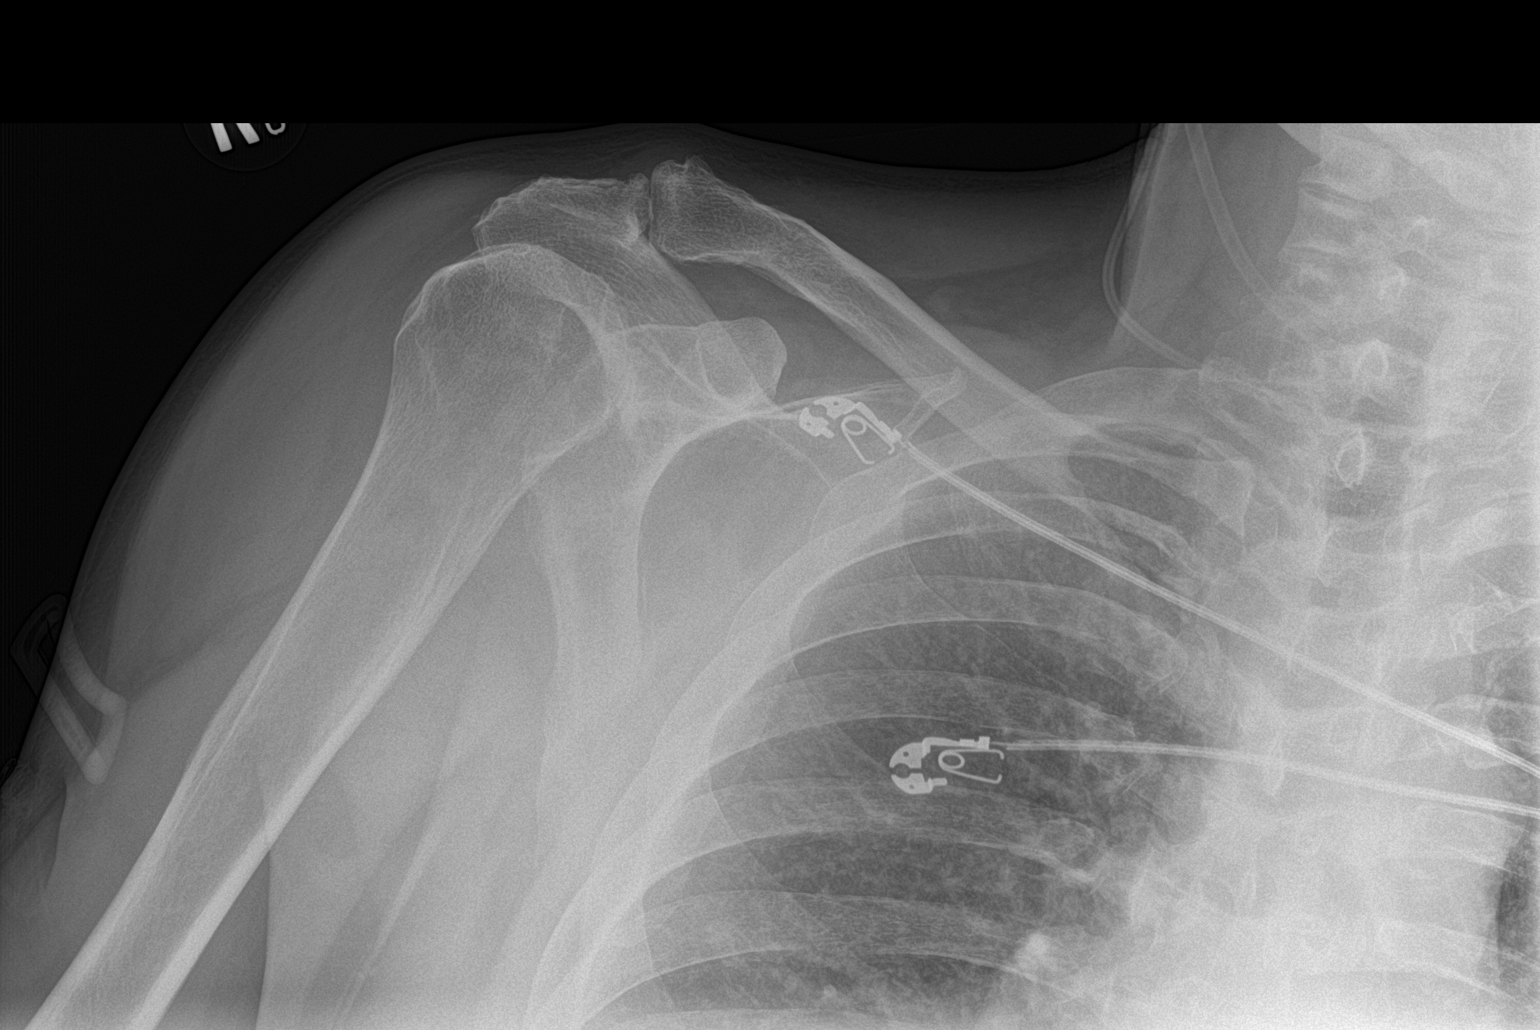
[im 2/3]
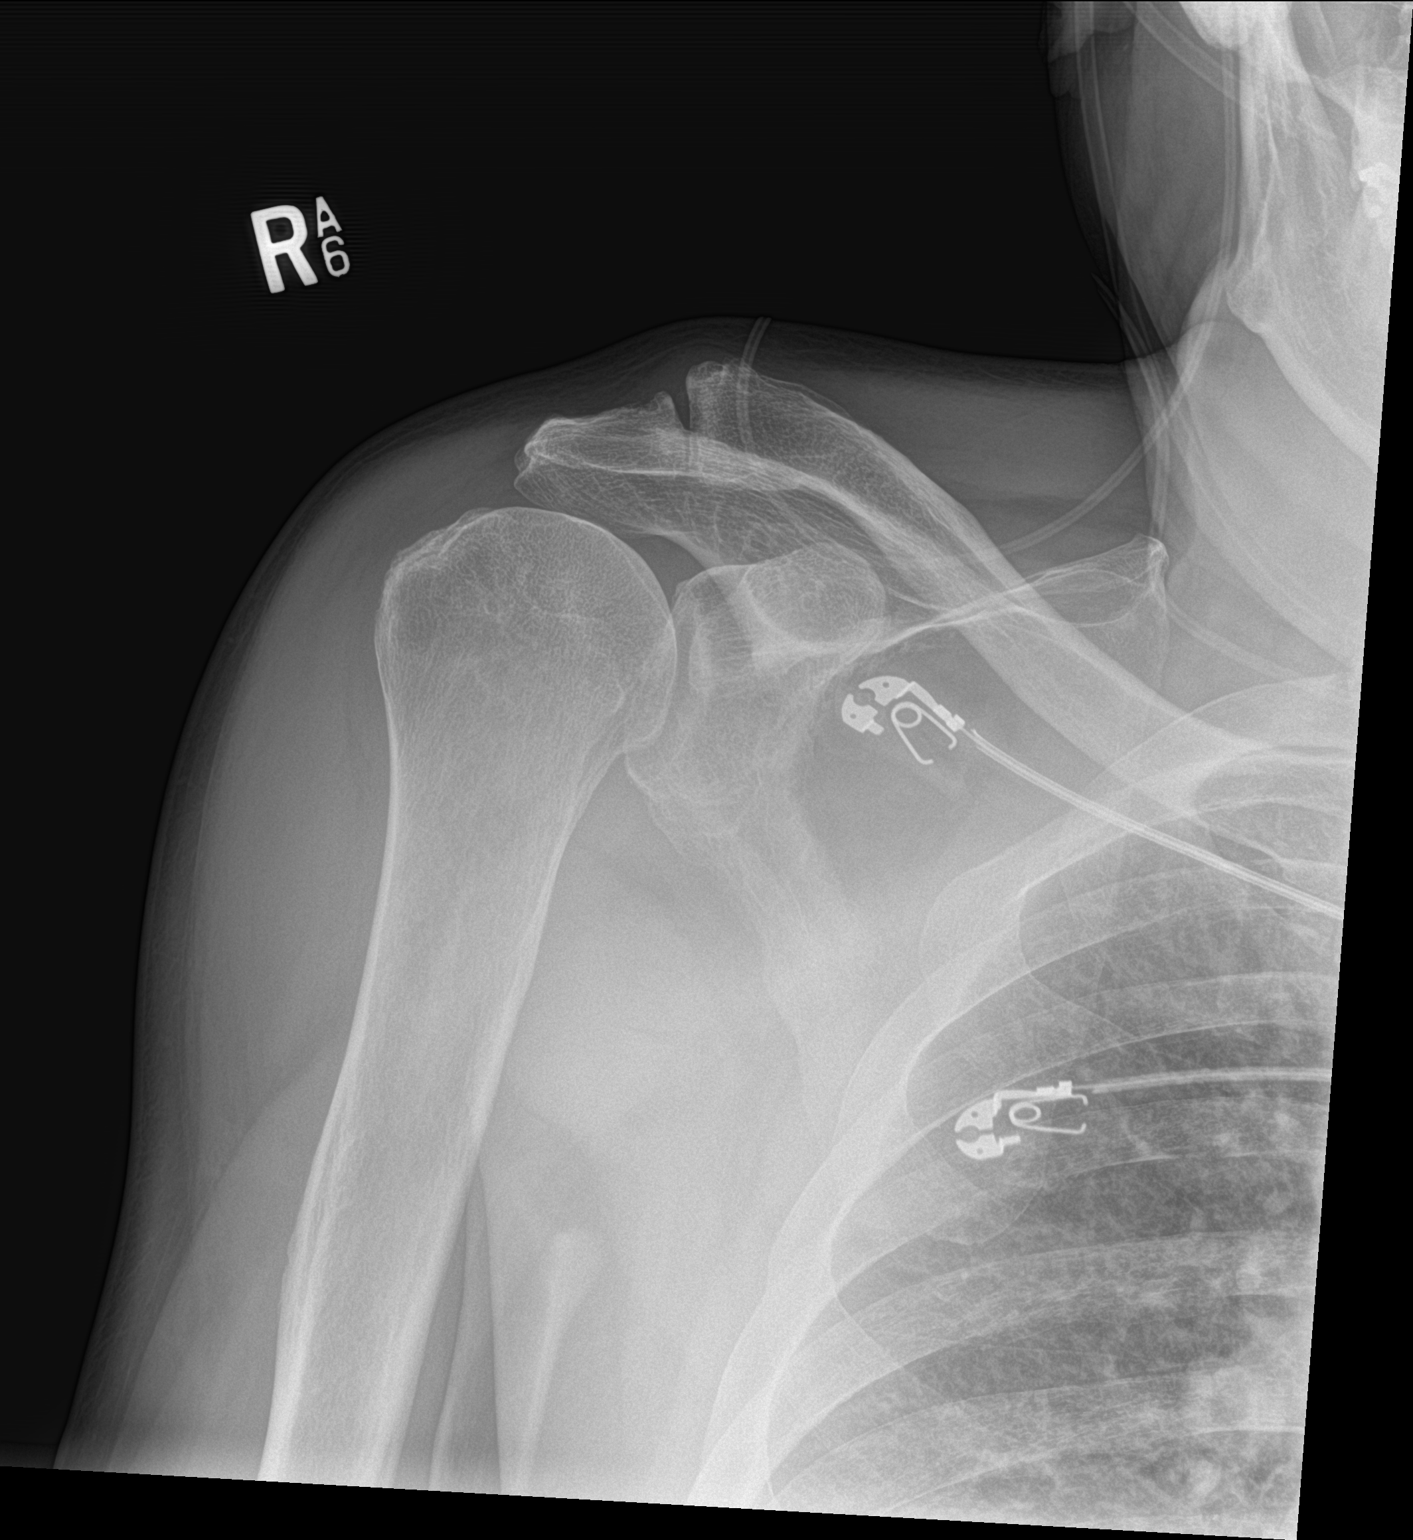
[im 3/3]
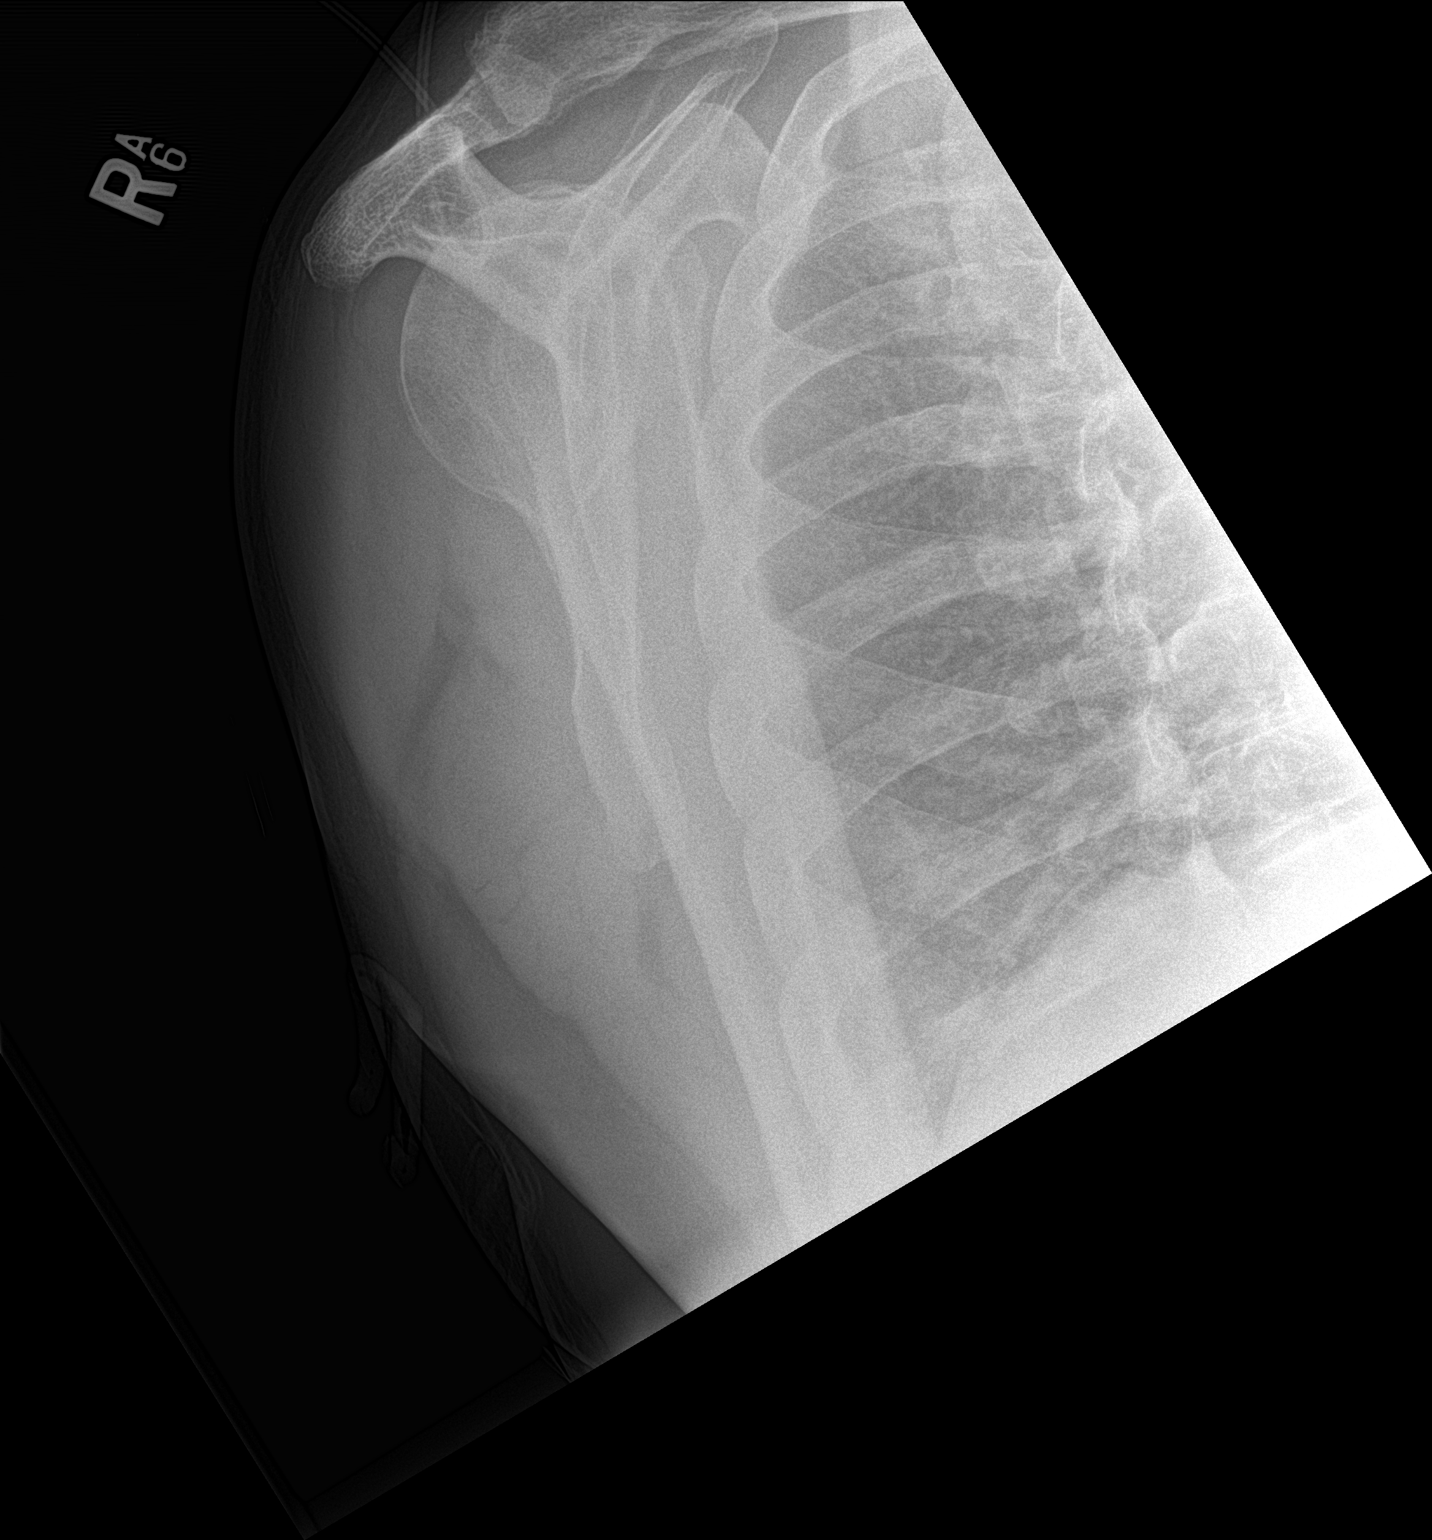

[3 of 3 positions shown; findings below may reference images not displayed]

FINDINGS: There is no fracture or dislocation. There are mild degenerative
changes of the acromioclavicular joint.
IMPRESSION: Interval reduction of right shoulder dislocation.

## 2020-02-01 ENCOUNTER — Ambulatory Visit (INDEPENDENT_AMBULATORY_CARE_PROVIDER_SITE_OTHER): Payer: 59 | Admitting: Sports Medicine

## 2020-02-01 ENCOUNTER — Encounter: Payer: Self-pay | Admitting: Sports Medicine

## 2020-02-01 ENCOUNTER — Other Ambulatory Visit: Payer: Self-pay

## 2020-02-01 ENCOUNTER — Ambulatory Visit (INDEPENDENT_AMBULATORY_CARE_PROVIDER_SITE_OTHER): Payer: 59

## 2020-02-01 DIAGNOSIS — M19071 Primary osteoarthritis, right ankle and foot: Secondary | ICD-10-CM

## 2020-02-01 MED ORDER — MELOXICAM 15 MG PO TABS
ORAL_TABLET | ORAL | 3 refills | Status: DC
Start: 2020-02-01 — End: 2021-02-07

## 2020-02-01 NOTE — Progress Notes (Signed)
    Procedures performed today:    None.  Independent interpretation of notes and tests performed by another provider:   None.  Brief History, Exam, Impression, and Recommendations:    [name] is a pleasant 59yo male who comes in with complaints of right anterior ankle pain that has been worsening. He feels stiff after not moving the foot which improves with movement. The pain is located at the anterior portion of the ankle above the Talus. He takes minimal ibuprofen which seems to help slightly. 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 Xrays from 45yrs ago shows some degenerative changes to his right ankle. His pain is most likely due to primary osteoarthritis of the ankle. We are going to get Xrays today to evaluate for osteoarthritis. We are also going to start him on Meloxicam for pain control. He will follow up in 41mo for reevaluation with potential for join injection.   Marcelino Duster, MS3   ___________________________________________ Gwen Her. Dianah Field, M.D., ABFM., CAQSM. Primary Care and [place] Instructor of [employer] [place] [employer] [facility] [employer] [facility]

## 2020-02-01 NOTE — Assessment & Plan Note (Signed)
This is a pleasant 59 year old male with chronic right ankle pain, gelling, pain is localized anteriorly, over the talus, as well as at the sinus tarsi and medial ankle. I did personally review x-rays from 2018 which showed mild ankle osteoarthritis, will be getting updated x-rays today, he will wear an ASO, switching from ibuprofen to meloxicam, return to see me in 4 weeks, injection if no better.

## 2020-02-08 ENCOUNTER — Encounter: Payer: 59 | Admitting: Family Medicine

## 2020-03-01 ENCOUNTER — Ambulatory Visit: Payer: 59 | Admitting: Sports Medicine

## 2020-12-24 ENCOUNTER — Other Ambulatory Visit: Payer: Self-pay

## 2020-12-24 ENCOUNTER — Emergency Department
Admission: RE | Admit: 2020-12-24 | Discharge: 2020-12-24 | Disposition: A | Discharge status: Home / Self Care | Payer: 59 | Source: Ambulatory Visit

## 2020-12-24 VITALS — BP 100/67 | HR 82 | Temp 98.1°F

## 2020-12-24 DIAGNOSIS — L739 Follicular disorder, unspecified: Secondary | ICD-10-CM | POA: Diagnosis not present

## 2020-12-24 MED ORDER — DOXYCYCLINE HYCLATE 100 MG PO CAPS: 100.0000 mg | ORAL_CAPSULE | Freq: Two times a day (BID) | ORAL | 0 refills | Status: AC

## 2020-12-24 NOTE — ED Triage Notes (Signed)
Patient states that he has a spot under his bottom lip that comes and goes.  The area clears up when he doesn't have to wear the mask, then it comes back.  More to the left of his face, it burns, not really painful.

## 2020-12-24 NOTE — ED Provider Notes (Signed)
[name]    CSN: [id number] Arrival date & time: 12/24/20  0945      History   Chief Complaint Chief Complaint  Patient presents with   Appointment    Facial sore    HPI Jon Galvan is a 60 y.o. male.   HPI 60 year old male presents with facial lesion under his bottom lip that comes and goes for the past several weeks.  Reports the more he wears his mask, frequency of facial lesion reappearing increases. PMH significant for lip lesion. 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 Authorizing Provider  doxycycline (VIBRAMYCIN) 100 MG capsule Take 1 capsule (100 mg total) by mouth 2 (two) times daily for 7 days. 12/24/20 12/31/20 Yes Eliezer Lofts, FNP  ibuprofen (ADVIL,MOTRIN) 200 MG tablet Take 800 mg by mouth every 6 (six) hours as needed for mild pain.    [provider]  meloxicam (MOBIC) 15 MG tablet One tab PO qAM with a meal for 2 weeks, then daily prn pain. 02/01/20   Silverio Decamp, MD    Family History History reviewed. No pertinent family history.  Social History Social History   Tobacco Use   Smoking status: Never   Smokeless tobacco: Never  Substance Use Topics   Alcohol use: Yes    Alcohol/week: 5.0  standard drinks    Types: 5 Cans of beer per week   Drug use: No     Allergies   Patient has no known allergies.   Review of Systems Review of Systems  Constitutional: Negative.   HENT: Negative.    Eyes: Negative.   Respiratory: Negative.    Cardiovascular: Negative.   Gastrointestinal: Negative.   Genitourinary: Negative.   Musculoskeletal: Negative.   Skin:  Positive for rash.  Neurological: Negative.     Physical Exam Triage Vital Signs ED Triage Vitals  Enc Vitals Group     BP 12/24/20 1016 100/67     Pulse Rate 12/24/20 1016 82     Resp --      Temp 12/24/20 1016 98.1 F (36.7 C)     Temp Source 12/24/20 1016 Oral     SpO2 12/24/20 1016 95 %     Weight --      Height --      Head Circumference --      Peak Flow --      Pain Score 12/24/20 1017 0     Pain Loc --      Pain Edu? --      Excl. in [place]? --    No data found.  Updated Vital Signs BP 100/67 (BP Location: Right Arm)   Pulse 82   Temp 98.1 F (36.7 C) (Oral)   SpO2 95%  Physical Exam Constitutional:      General: He is not in acute distress.    Appearance: Normal appearance. He is normal weight. He is not ill-appearing.  HENT:     Head: Normocephalic and atraumatic.     Mouth/Throat:     Mouth: Mucous membranes are moist.     Pharynx: Oropharynx is clear.  Eyes:     Extraocular Movements: Extraocular movements intact.     Conjunctiva/sclera: Conjunctivae normal.     Pupils: Pupils are equal, round, and reactive to light.  Cardiovascular:     Rate and Rhythm: Normal rate and regular rhythm.     Pulses: Normal pulses.     Heart sounds: Normal heart sounds. No murmur heard. Pulmonary:     Effort: Pulmonary effort is normal. No respiratory distress.     Breath sounds: Normal breath sounds. No stridor. No wheezing, rhonchi or rales.  Musculoskeletal:        General: Normal range of motion.     Cervical back: Normal range of motion and neck supple. No tenderness.   Lymphadenopathy:     Cervical: No cervical adenopathy.  Skin:    General: Skin is warm and dry.     Comments: Face (just inferior to left side of lower lip): Tiny <0.5 cm, erythematous papule, patient reports infrequent mucopurulent discharge/drainage  Neurological:     General: No focal deficit present.     Mental Status: He is alert.  Psychiatric:        Mood and Affect: Mood normal.        Behavior: Behavior normal.     UC Treatments / Results  Labs (all labs ordered are listed, but only abnormal results are displayed) Labs Reviewed - No data to display  EKG   Radiology No results found.  Procedures Procedures (including critical care time)  Medications Ordered in UC Medications - No data to display  Initial Impression / Assessment and Plan / UC Course  I have reviewed the triage vital signs and the nursing notes.  Pertinent labs & imaging results that were available during my care of the patient were reviewed by me and considered in my medical decision making (see chart for details).     MDM: 1. Folliculitis. Patient discharged home, hemodynamically stable. Final Clinical Impressions(s) / UC Diagnoses   Final diagnoses:  Folliculitis     Discharge Instructions      Advised patient to take medication as directed with food to completion.  Encourage patient increase daily water intake while taking this medication.     ED Prescriptions     Medication Sig Dispense Auth. Provider   doxycycline (VIBRAMYCIN) 100 MG capsule Take 1 capsule (100 mg total) by mouth 2 (two) times daily for 7 days. 14 capsule Eliezer Lofts, FNP      PDMP not reviewed this encounter.   Eliezer Lofts, Rachel 12/24/20 1127

## 2020-12-24 NOTE — Discharge Instructions (Addendum)
Advised patient to take medication as directed with food to completion.  Encourage patient increase daily water intake while taking this medication.

## 2021-01-27 IMAGING — DX DG ANKLE COMPLETE 3+V*R*
3 series · 4 of 4 positions shown · non-contrast
Comparison: None.

CLINICAL DATA: Chronic right ankle pain.

EXAM:
RIGHT ANKLE - COMPLETE 3+ VIEW

[ankle ap]
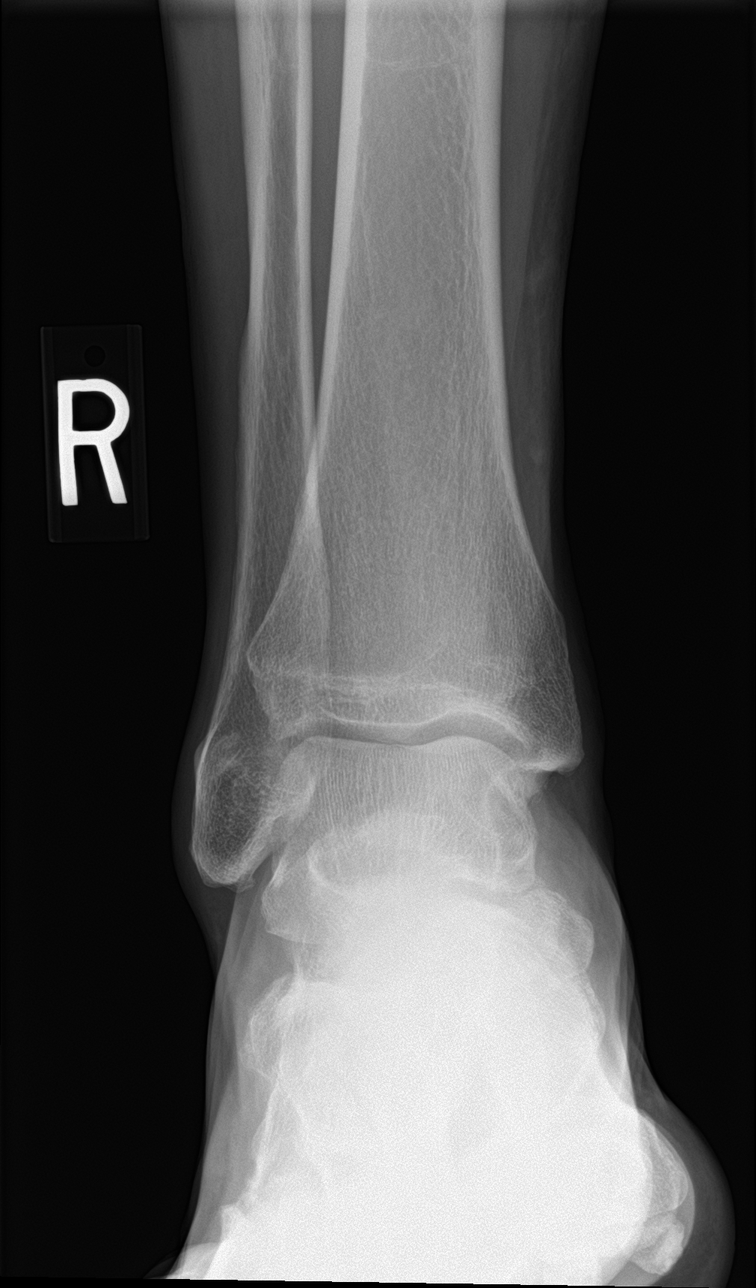

[Series 2: ankle obl · 0.14mm/px · 2 of 2 slices shown]
[im 1/2]
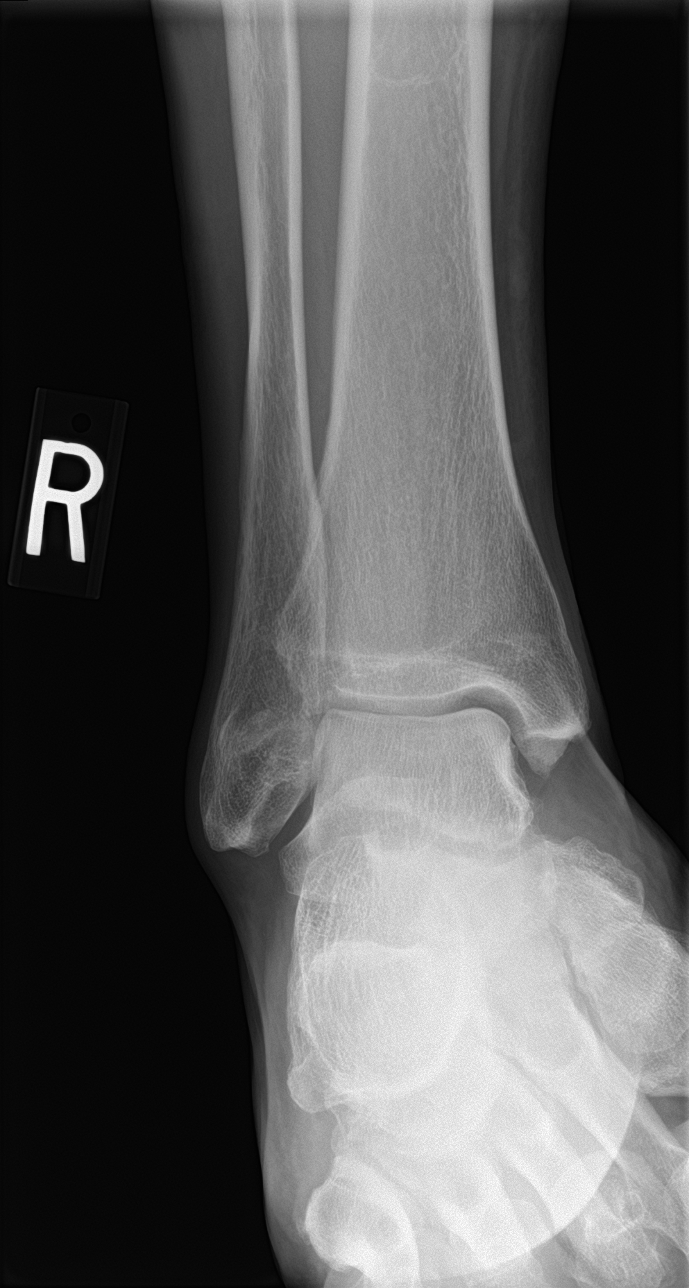
[im 2/2]
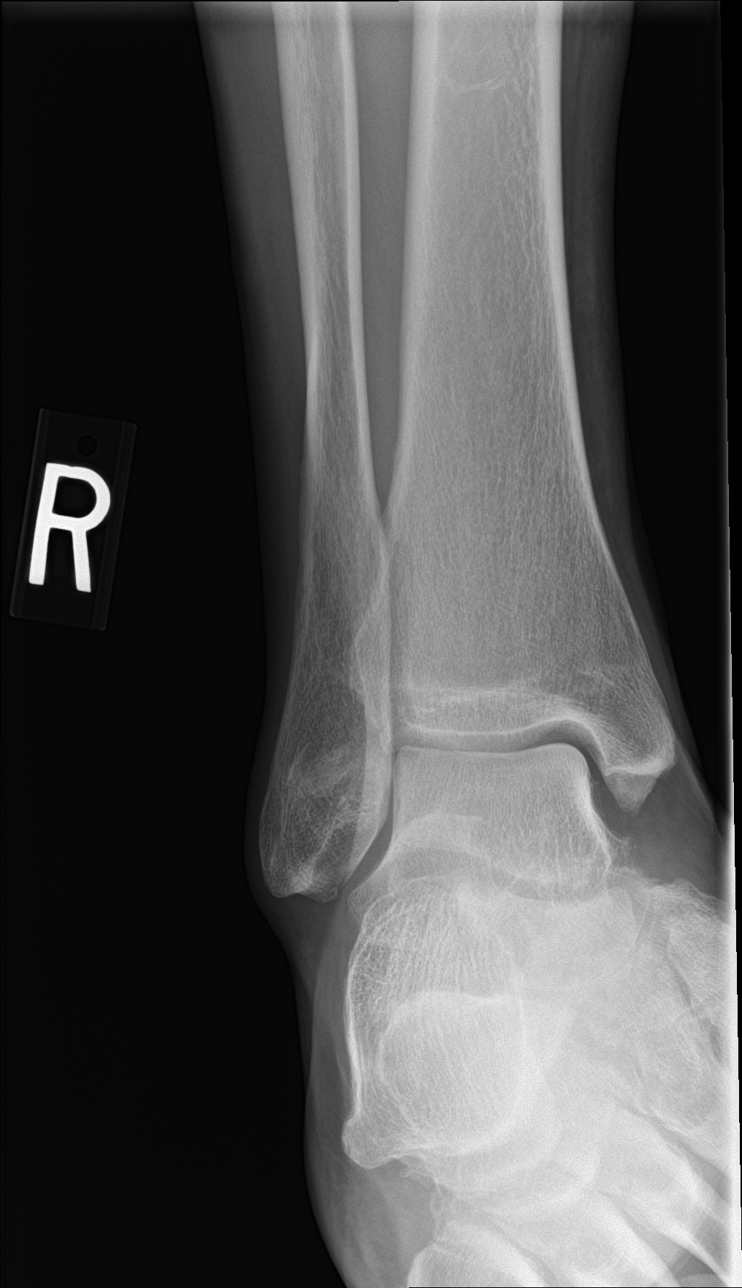

[ankle lat]
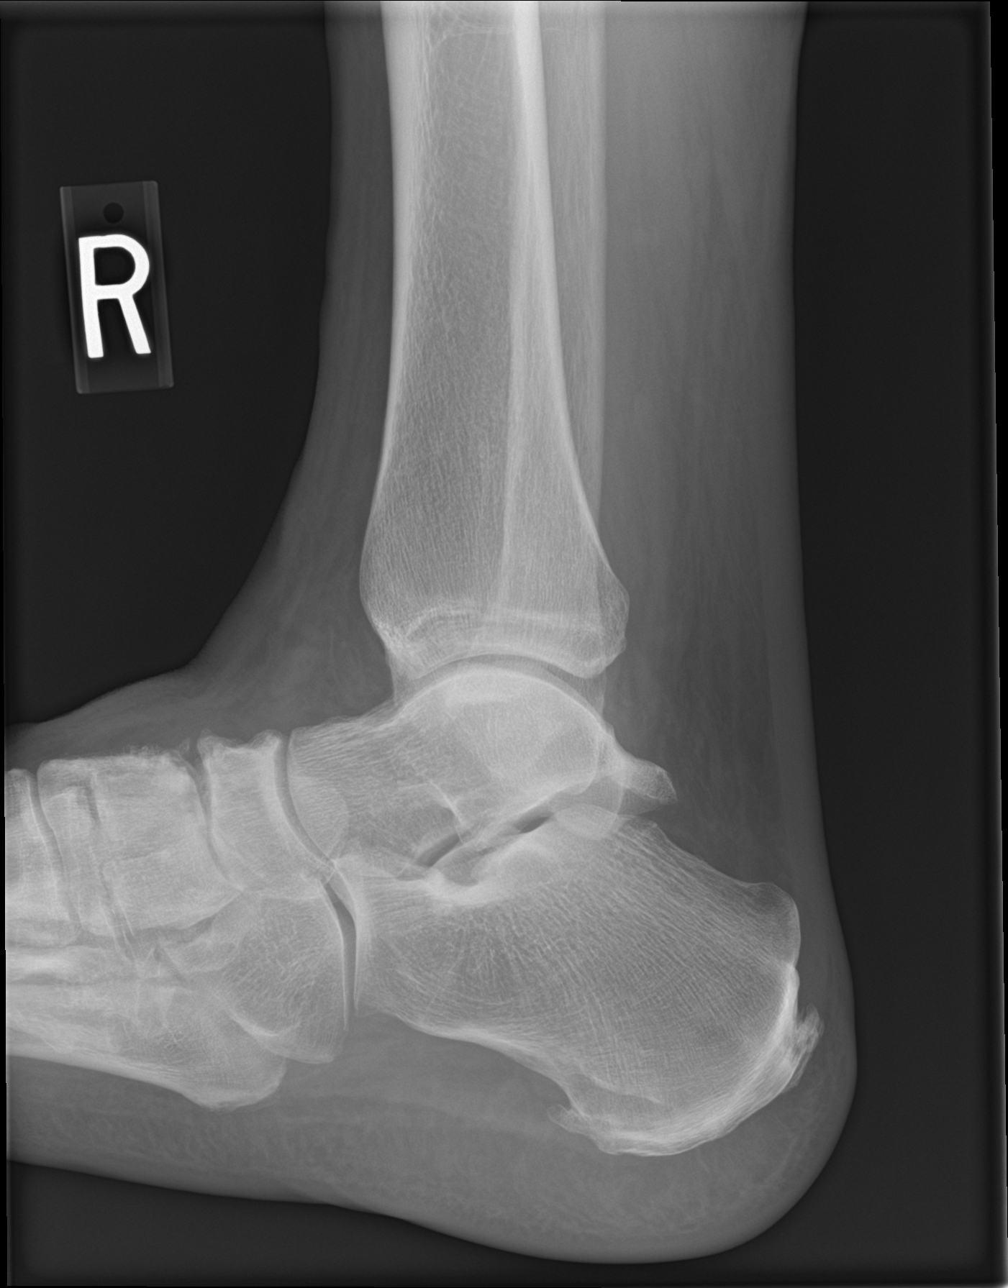

[4 of 4 positions shown; findings below may reference images not displayed]

FINDINGS: There is no evidence of fracture, dislocation, or joint effusion.
There is no evidence of arthropathy or other focal bone abnormality.
Soft tissues are unremarkable.
IMPRESSION: Negative.

## 2021-02-07 ENCOUNTER — Ambulatory Visit: Payer: 59 | Admitting: Medical-Surgical

## 2021-02-07 ENCOUNTER — Other Ambulatory Visit: Payer: Self-pay

## 2021-02-07 ENCOUNTER — Encounter: Payer: Self-pay | Admitting: Medical-Surgical

## 2021-02-07 VITALS — BP 123/79 | HR 89 | Resp 20 | Ht 75.0 in | Wt 217.0 lb

## 2021-02-07 DIAGNOSIS — Z0000 Encounter for general adult medical examination without abnormal findings: Secondary | ICD-10-CM | POA: Diagnosis not present

## 2021-02-07 DIAGNOSIS — E7800 Pure hypercholesterolemia, unspecified: Secondary | ICD-10-CM

## 2021-02-07 DIAGNOSIS — Z7689 Persons encountering health services in other specified circumstances: Secondary | ICD-10-CM | POA: Diagnosis not present

## 2021-02-07 DIAGNOSIS — Z125 Encounter for screening for malignant neoplasm of prostate: Secondary | ICD-10-CM

## 2021-02-07 DIAGNOSIS — Z131 Encounter for screening for diabetes mellitus: Secondary | ICD-10-CM

## 2021-02-07 DIAGNOSIS — Z1329 Encounter for screening for other suspected endocrine disorder: Secondary | ICD-10-CM

## 2021-02-07 DIAGNOSIS — K1370 Unspecified lesions of oral mucosa: Secondary | ICD-10-CM

## 2021-02-07 MED ORDER — VALACYCLOVIR HCL 1 G PO TABS: 2000.0000 mg | ORAL_TABLET | Freq: Two times a day (BID) | ORAL | 0 refills | Status: AC

## 2021-02-07 NOTE — Patient Instructions (Signed)
Preventive Care 60-60 Years Old, Male Preventive care refers to lifestyle choices and visits with your health care provider that can promote health and wellness. This includes: A yearly physical exam. This is also called an annual wellness visit. Regular dental and eye exams. Immunizations. Screening for certain conditions. Healthy lifestyle choices, such as: Eating a healthy diet. Getting regular exercise. Not using drugs or products that contain nicotine and tobacco. Limiting alcohol use. What can I expect for my preventive care visit? Physical exam Your health care provider will check your: Height and weight. These may be used to calculate your BMI (body mass index). BMI is a measurement that tells if you are at a healthy weight. Heart rate and blood pressure. Body temperature. Skin for abnormal spots. Counseling Your health care provider may ask you questions about your: Past medical problems. Family's medical history. Alcohol, tobacco, and drug use. Emotional well-being. Home life and relationship well-being. Sexual activity. Diet, exercise, and sleep habits. Work and work environment. Access to firearms. What immunizations do I need?  Vaccines are usually given at various ages, according to a schedule. Your health care provider will recommend vaccines for you based on your age, medicalhistory, and lifestyle or other factors, such as travel or where you work. What tests do I need? Blood tests Lipid and cholesterol levels. These may be checked every 5 years, or more often if you are over 60 years old. Hepatitis C test. Hepatitis B test. Screening Lung cancer screening. You may have this screening every year starting at age 60 if you have a 30-pack-year history of smoking and currently smoke or have quit within the past 15 years. Prostate cancer screening. Recommendations will vary depending on your family history and other risks. Genital exam to check for testicular cancer  or hernias. Colorectal cancer screening. All adults should have this screening starting at age 60 and continuing until age 75. Your health care provider may recommend screening at age 60 if you are at increased risk. You will have tests every 1-10 years, depending on your results and the type of screening test. Diabetes screening. This is done by checking your blood sugar (glucose) after you have not eaten for a while (fasting). You may have this done every 1-3 years. STD (sexually transmitted disease) testing, if you are at risk. Follow these instructions at home: Eating and drinking  Eat a diet that includes fresh fruits and vegetables, whole grains, lean protein, and low-fat dairy products. Take vitamin and mineral supplements as recommended by your health care provider. Do not drink alcohol if your health care provider tells you not to drink. If you drink alcohol: Limit how much you have to 0-2 drinks a day. Be aware of how much alcohol is in your drink. In the [place] one drink equals one 12 oz bottle of beer (355 mL), one 5 oz glass of wine (148 mL), or one 1 oz glass of hard liquor (44 mL).  Lifestyle Take daily care of your teeth and gums. Brush your teeth every morning and night with fluoride toothpaste. Floss one time each day. Stay active. Exercise for at least 30 minutes 5 or more days each week. Do not use any products that contain nicotine or tobacco, such as cigarettes, e-cigarettes, and chewing tobacco. If you need help quitting, ask your health care provider. Do not use drugs. If you are sexually active, practice safe sex. Use a condom or other form of protection to prevent STIs (sexually transmitted infections). If told by   your health care provider, take low-dose aspirin daily starting at age 60. Find healthy ways to cope with stress, such as: Meditation, yoga, or listening to music. Journaling. Talking to a trusted person. Spending time with friends and  family. Safety Always wear your seat belt while driving or riding in a vehicle. Do not drive: If you have been drinking alcohol. Do not ride with someone who has been drinking. When you are tired or distracted. While texting. Wear a helmet and other protective equipment during sports activities. If you have firearms in your house, make sure you follow all gun safety procedures. What's next? Go to your health care provider once a year for an annual wellness visit. Ask your health care provider how often you should have your eyes and teeth checked. Stay up to date on all vaccines. This information is not intended to replace advice given to you by your health care provider. Make sure you discuss any questions you have with your healthcare provider. Document Revised: 03/30/2019 Document Reviewed: 06/25/2018 Elsevier Patient Education  2022 [employer] Inc[employer]  

## 2021-02-07 NOTE — Progress Notes (Signed)
HPI: Jon Galvan is a 60 y.o. 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  he presents to [facility] today, 02/07/21,  for chief complaint of: Annual physical exam  Dentist: every 6 months Eye exam: 2-3 years ago, Lasik in 1997, no correction Exercise: active at work, hiking, swimming Diet: doing good, dietary mods for wife's DM Colon cancer screening: UTD Prostate cancer screening: due COVID vaccine: Done x 1 J&J, booster done  Concerns: Lesion on lip.  Was seen in June at urgent care for what was determined to be folliculitis.  He took doxycycline and noted that the lesion finally healed.  Shortly after, about 2 weeks, he had another lesion pop up on the right side of his lower lip.  He does work in a [employer] on a regular basis but has started using antibacterial wipes and then cleansers on his face to try and prevent these issues.  Notes that the lesions never have much drainage but they do burn.  He wonders if they are more like cold sores than bacterial infections in the skin.  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 No pertinent family history.   Current medication list and allergy/intolerance information reviewed:    Current Outpatient Medications  Medication Sig Dispense Refill   ibuprofen (ADVIL,MOTRIN) 200 MG tablet Take 800 mg by mouth every 6 (six) hours as needed for mild pain.     valACYclovir (VALTREX) 1000 MG tablet Take 2 tablets (2,000 mg total) by mouth 2 (two) times daily. 4 tablet 0   No current facility-administered medications for this visit.    No Known Allergies    Review of Systems: Constitutional:  No  fever, no chills, No recent illness, No unintentional weight changes. No significant fatigue.  HEENT: No  headache, no vision change, no hearing change, No sore throat, No  sinus pressure Cardiac: No  chest pain, No  pressure, No palpitations, No  Orthopnea Respiratory:  No  shortness of breath. No  Cough Gastrointestinal: No  abdominal pain, No  nausea, No  vomiting,  No  blood in stool, No  diarrhea, No  constipation  Musculoskeletal: No new myalgia/arthralgia Skin: No  Rash, + scabbed right lower lip lesion Genitourinary: No  incontinence, No  abnormal genital bleeding, No abnormal genital discharge Hem/Onc: No  easy bruising/bleeding, No  abnormal lymph node Endocrine: No cold intolerance,  No heat intolerance. No polyuria/polydipsia/polyphagia  Neurologic: No  weakness, No  dizziness, No  slurred speech/focal weakness/facial droop Psychiatric: No  concerns with depression, No  concerns with anxiety, No sleep problems, No mood problems  Exam:  BP 123/79 (BP Location: Left Arm, Patient Position: Sitting, Cuff Size: Normal)   Pulse 89   Resp 20   Ht '6\' 3"'$  (1.905 m)   Wt 217 lb (98.4 kg)   SpO2 97%   BMI 27.12 kg/m  Constitutional: VS see above. General Appearance: alert, well-developed, well-nourished, NAD Eyes: Normal lids and conjunctive, non-icteric sclera Ears, Nose, Mouth, Throat: MMM, Normal external inspection  ears/nares/mouth/lips/gums. TM normal bilaterally.   Neck: No masses, trachea midline. No thyroid enlargement. No tenderness/mass appreciated. No lymphadenopathy Respiratory: Normal respiratory effort. no wheeze, no rhonchi, no rales Cardiovascular: S1/S2 normal, no murmur, no rub/gallop auscultated. RRR. No lower extremity edema. No carotid bruit or JVD. No abdominal aortic bruit. Gastrointestinal: Nontender, no masses. No hepatomegaly, no splenomegaly. No hernia appreciated. Bowel sounds normal. Rectal exam deferred.  Musculoskeletal: Gait normal. No clubbing/cyanosis of digits.  Neurological: Normal balance/coordination. No tremor. No cranial nerve deficit on limited exam. Motor and sensation intact and symmetric. Cerebellar reflexes intact.  Skin: warm, dry, intact. No rash/ulcer. No concerning nevi or subq nodules on limited exam.   Psychiatric: Normal judgment/insight. Normal mood and affect. Oriented x3.    ASSESSMENT/PLAN:   1. Encounter to establish care Reviewed available information and discussed healthcare concerns with patient.  2. Annual physical exam Checking CBC with differential, CMP, and lipid panel today. - CBC with Differential/Platelet - COMPLETE METABOLIC PANEL WITH GFR - Lipid panel  3. Thyroid disorder screen Checking TSH today - TSH  4. Diabetes mellitus screening Checking hemoglobin A1c today. - Hemoglobin A1c  5. Prostate cancer screening Checking PSA today. - PSA  6. Oral lesion Oral lesions are somewhat suspicious for cold sores.  Valtrex 2000 mg twice daily x1 day provided.  Patient advised to give this a try to see if this will help resolve the lesion faster.  Can also try Abreva that is available over-the-counter for topical use.  Orders Placed This Encounter  Procedures   CBC with Differential/Platelet   COMPLETE METABOLIC PANEL WITH GFR   Lipid panel   TSH   Hemoglobin A1c   PSA   HM COLONOSCOPY    Meds ordered this encounter   Medications   valACYclovir (VALTREX) 1000 MG tablet    Sig: Take 2 tablets (2,000 mg total) by mouth 2 (two) times daily.    Dispense:  4 tablet    Refill:  0    Order Specific Question:   Supervising Provider    Answer:   Emeterio Reeve [id number]    Patient Instructions  Preventive Care 74-71 Years Old, Male Preventive care refers to lifestyle choices and visits with your health care provider that can promote health and wellness. This includes: A yearly physical exam. This is also called an annual wellness visit. Regular dental and eye exams. Immunizations. Screening for certain conditions. Healthy lifestyle choices, such as: Eating a healthy diet. Getting regular exercise. Not using drugs or products that contain nicotine and tobacco. Limiting alcohol use. What can I expect for my preventive care visit? Physical exam Your health care provider will check your: Height and weight. These may be used to calculate your BMI (body mass index). BMI is a measurement that tells if you are at a healthy weight. Heart rate and blood pressure. Body temperature. Skin for abnormal spots. Counseling Your health care provider may ask you questions about your: Past medical problems. Family's medical history. Alcohol, tobacco, and drug use. Emotional well-being. Home life and relationship well-being. Sexual activity. Diet, exercise, and sleep habits. Work  and work environment. Access to firearms. What immunizations do I need?  Vaccines are usually given at various ages, according to a schedule. Your health care provider will recommend vaccines for you based on your age, medicalhistory, and lifestyle or other factors, such as travel or where you work. What tests do I need? Blood tests Lipid and cholesterol levels. These may be checked every 5 years, or more often if you are over 44 years old. Hepatitis C test. Hepatitis B test. Screening Lung cancer screening. You may have this  screening every year starting at age 59 if you have a 30-pack-year history of smoking and currently smoke or have quit within the past 15 years. Prostate cancer screening. Recommendations will vary depending on your family history and other risks. Genital exam to check for testicular cancer or hernias. Colorectal cancer screening. All adults should have this screening starting at age 63 and continuing until age 53. Your health care provider may recommend screening at age 86 if you are at increased risk. You will have tests every 1-10 years, depending on your results and the type of screening test. Diabetes screening. This is done by checking your blood sugar (glucose) after you have not eaten for a while (fasting). You may have this done every 1-3 years. STD (sexually transmitted disease) testing, if you are at risk. Follow these instructions at home: Eating and drinking  Eat a diet that includes fresh fruits and vegetables, whole grains, lean protein, and low-fat dairy products. Take vitamin and mineral supplements as recommended by your health care provider. Do not drink alcohol if your health care provider tells you not to drink. If you drink alcohol: Limit how much you have to 0-2 drinks a day. Be aware of how much alcohol is in your drink. In the [place] one drink equals one 12 oz bottle of beer (355 mL), one 5 oz glass of wine (148 mL), or one 1 oz glass of hard liquor (44 mL).  Lifestyle Take daily care of your teeth and gums. Brush your teeth every morning and night with fluoride toothpaste. Floss one time each day. Stay active. Exercise for at least 30 minutes 5 or more days each week. Do not use any products that contain nicotine or tobacco, such as cigarettes, e-cigarettes, and chewing tobacco. If you need help quitting, ask your health care provider. Do not use drugs. If you are sexually active, practice safe sex. Use a condom or other form of protection to prevent STIs (sexually  transmitted infections). If told by your health care provider, take low-dose aspirin daily starting at age 69. Find healthy ways to cope with stress, such as: Meditation, yoga, or listening to music. Journaling. Talking to a trusted person. Spending time with friends and family. Safety Always wear your seat belt while driving or riding in a vehicle. Do not drive: If you have been drinking alcohol. Do not ride with someone who has been drinking. When you are tired or distracted. While texting. Wear a helmet and other protective equipment during sports activities. If you have firearms in your house, make sure you follow all gun safety procedures. What's next? Go to your health care provider once a year for an annual wellness visit. Ask your health care provider how often you should have your eyes and teeth checked. Stay up to date on all vaccines. This information is not intended to replace advice given to you by your health care provider. Make sure you discuss any questions you have with  your healthcare provider. Document Revised: 03/30/2019 Document Reviewed: 06/25/2018 Elsevier Patient Education  2022 [employer].  Follow-up plan: Return in about 1 year (around 02/07/2022) for annual physical exam.  Clearnce Sorrel, DNP, APRN, FNP-BC [facility]

## 2021-02-08 ENCOUNTER — Encounter: Payer: Self-pay | Admitting: Medical-Surgical

## 2021-02-08 LAB — COMPLETE METABOLIC PANEL WITH GFR
AG Ratio: 1.5 (calc) (ref 1.0–2.5)
ALT: 20 U/L (ref 9–46)
AST: 20 U/L (ref 10–35)
Albumin: 4.5 g/dL (ref 3.6–5.1)
Alkaline phosphatase (APISO): 87 U/L (ref 35–144)
BUN: 20 mg/dL (ref 7–25)
CO2: 26 mmol/L (ref 20–32)
Calcium: 9.7 mg/dL (ref 8.6–10.3)
Chloride: 104 mmol/L (ref 98–110)
Creat: 0.79 mg/dL (ref 0.70–1.35)
Globulin: 3.1 g/dL (calc) (ref 1.9–3.7)
Glucose, Bld: 83 mg/dL (ref 65–99)
Potassium: 4.2 mmol/L (ref 3.5–5.3)
Sodium: 139 mmol/L (ref 135–146)
Total Bilirubin: 0.9 mg/dL (ref 0.2–1.2)
Total Protein: 7.6 g/dL (ref 6.1–8.1)
eGFR: 102 mL/min/{1.73_m2} (ref >=60)

## 2021-02-08 LAB — LIPID PANEL
Cholesterol: 194 mg/dL (ref <200)
HDL: 42 mg/dL (ref >=40)
LDL Cholesterol (Calc): 118 mg/dL (calc) — ABNORMAL HIGH
Non-HDL Cholesterol (Calc): 152 mg/dL (calc) — ABNORMAL HIGH (ref <130)
Total CHOL/HDL Ratio: 4.6 (calc) (ref <5.0)
Triglycerides: 224 mg/dL — ABNORMAL HIGH (ref <150)

## 2021-02-08 LAB — HEMOGLOBIN A1C
Hgb A1c MFr Bld: 5.2 % of total Hgb (ref <5.7)
Mean Plasma Glucose: 103 mg/dL
eAG (mmol/L): 5.7 mmol/L

## 2021-02-08 LAB — CBC WITH DIFFERENTIAL/PLATELET
Absolute Monocytes: 1254 cells/uL — ABNORMAL HIGH (ref 200–950)
Basophils Absolute: 68 cells/uL (ref 0–200)
Basophils Relative: 0.6 %
Eosinophils Absolute: 171 cells/uL (ref 15–500)
Eosinophils Relative: 1.5 %
HCT: 48.1 % (ref 38.5–50.0)
Hemoglobin: 16.8 g/dL (ref 13.2–17.1)
Lymphs Abs: 3659 cells/uL (ref 850–3900)
MCH: 33 pg (ref 27.0–33.0)
MCHC: 34.9 g/dL (ref 32.0–36.0)
MCV: 94.5 fL (ref 80.0–100.0)
MPV: 9.3 fL (ref 7.5–12.5)
Monocytes Relative: 11 %
Neutro Abs: 6247 cells/uL (ref 1500–7800)
Neutrophils Relative %: 54.8 %
Platelets: 512 10*3/uL — ABNORMAL HIGH (ref 140–400)
RBC: 5.09 10*6/uL (ref 4.20–5.80)
RDW: 12.7 % (ref 11.0–15.0)
Total Lymphocyte: 32.1 %
WBC: 11.4 10*3/uL — ABNORMAL HIGH (ref 3.8–10.8)

## 2021-02-08 LAB — PSA: PSA: 0.79 ng/mL (ref <=4.00)

## 2021-02-08 LAB — TSH: TSH: 2.13 mIU/L (ref 0.40–4.50)

## 2021-02-08 MED ORDER — ATORVASTATIN CALCIUM 20 MG PO TABS: 20.0000 mg | ORAL_TABLET | Freq: Every day | ORAL | 3 refills | Status: AC

## 2021-02-08 NOTE — Addendum Note (Signed)
Addended bySamuel Bouche on: 02/08/2021 09:37 AM   Modules accepted: Orders

## 2021-02-13 ENCOUNTER — Other Ambulatory Visit: Payer: Self-pay | Admitting: Medical-Surgical

## 2021-02-13 ENCOUNTER — Encounter: Payer: Self-pay | Admitting: Medical-Surgical

## 2021-02-13 MED ORDER — MUPIROCIN 2 % EX OINT: TOPICAL_OINTMENT | CUTANEOUS | 3 refills | Status: AC
